# Patient Record
Sex: Female | Born: 1965 | Smoking: Current some day smoker
Health system: Southern US, Community
[De-identification: ages and names within clinical notes are randomized; demographics above are authoritative.]

---

## 2018-08-08 ENCOUNTER — Other Ambulatory Visit (HOSPITAL_COMMUNITY)
Admission: RE | Admit: 2018-08-08 | Discharge: 2018-08-08 | Disposition: A | Discharge status: Home / Self Care | Payer: Medicaid Other | Source: Ambulatory Visit | Attending: Nurse Practitioner | Admitting: Nurse Practitioner

## 2018-08-08 ENCOUNTER — Other Ambulatory Visit: Payer: Self-pay

## 2018-08-08 DIAGNOSIS — Z01411 Encounter for gynecological examination (general) (routine) with abnormal findings: Secondary | ICD-10-CM | POA: Diagnosis present

## 2018-08-10 LAB — CYTOLOGY - PAP
Adequacy: ABSENT
DIAGNOSIS: NEGATIVE
HPV (WINDOPATH): NOT DETECTED

## 2019-03-21 ENCOUNTER — Encounter: Payer: Self-pay | Admitting: *Deleted

## 2019-04-05 ENCOUNTER — Other Ambulatory Visit: Payer: Self-pay

## 2019-04-05 ENCOUNTER — Other Ambulatory Visit (HOSPITAL_COMMUNITY)
Admission: RE | Admit: 2019-04-05 | Discharge: 2019-04-05 | Disposition: A | Discharge status: Home / Self Care | Payer: Medicaid Other | Source: Ambulatory Visit | Attending: Obstetrics & Gynecology | Admitting: Obstetrics & Gynecology

## 2019-04-05 ENCOUNTER — Encounter: Payer: Self-pay | Admitting: Obstetrics & Gynecology

## 2019-04-05 ENCOUNTER — Ambulatory Visit (INDEPENDENT_AMBULATORY_CARE_PROVIDER_SITE_OTHER): Payer: Medicaid Other | Admitting: Obstetrics & Gynecology

## 2019-04-05 VITALS — BP 117/78 | HR 76 | Wt 147.0 lb

## 2019-04-05 DIAGNOSIS — N924 Excessive bleeding in the premenopausal period: Secondary | ICD-10-CM

## 2019-04-05 DIAGNOSIS — N9089 Other specified noninflammatory disorders of vulva and perineum: Secondary | ICD-10-CM

## 2019-04-05 DIAGNOSIS — N946 Dysmenorrhea, unspecified: Secondary | ICD-10-CM | POA: Insufficient documentation

## 2019-04-05 NOTE — Progress Notes (Addendum)
Patient ID: Maquita Munda, female   DOB: [birth year], 52 y.o.   MRN: [id number]  Chief Complaint  Patient presents with  . Dysmenorrhea    HPI Cassadee Muntean is a 53 y.o. female.  No obstetric history on file. No LMP recorded. Patient reports increasing menstrual pain and flow with more frequent periods as well over the last year. She also has hot flushes and fatigue. HPI  No past medical history on file.    No family history on file.  Social History Social History   Tobacco Use  . Smoking status: Current Some Day Smoker  Substance Use Topics  . Alcohol use: Not on file  . Drug use: Not on file    Allergies  Allergen Reactions  . Banana     Current Outpatient Medications  Medication Sig Dispense Refill  . levothyroxine (SYNTHROID) 125 MCG tablet Take 125 mcg by mouth daily before breakfast.    . buPROPion (WELLBUTRIN XL) 150 MG 24 hr tablet Take 150 mg by mouth daily.     No current facility-administered medications for this visit.     Review of Systems Review of Systems  Constitutional: Positive for fatigue.  Respiratory: Negative.   Cardiovascular: Negative.   Genitourinary: Positive for menstrual problem and vaginal pain. Negative for vaginal bleeding and vaginal discharge (vulvar lesion).    Blood pressure 117/78, pulse 76, weight 147 lb (66.7 kg).  Physical Exam Physical Exam Vitals signs and nursing note reviewed. Exam conducted with a chaperone present.  Constitutional:      Appearance: Normal appearance.  Cardiovascular:     Rate and Rhythm: Normal rate.  Pulmonary:     Effort: Pulmonary effort is normal.     Breath sounds: Normal breath sounds.  Abdominal:     General: Abdomen is flat.     Palpations: Abdomen is soft.  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  Neurological:     General: No focal  deficit present.     Mental Status: She is alert and oriented to person, place, and time.  Psychiatric:        Mood and Affect: Mood normal.   Breasts: breasts appear normal, no suspicious masses, no skin or nipple changes or axillary nodes.   Data Reviewed Pap normal 08/2018  Assessment Vulvar lesion There are no active problems to display for this patient.  Patient Active Problem List   Diagnosis Date Noted  . Excessive bleeding in premenopausal period 04/05/2019  . Vulvar lesion 04/05/2019  . Dysmenorrhea 04/05/2019      Plan Orders Placed This Encounter  Procedures  . US PELVIC COMPLETE WITH TRANSVAGINAL    Standing Status:   Future    Standing Expiration Date:   06/04/2020    Order Specific Question:   Reason for Exam (SYMPTOM  OR DIAGNOSIS REQUIRED)    Answer:   menorrhagia    Order Specific Question:   Preferred imaging location?    Answer:   [facility]  . Follicle stimulating hormone  . CBC   RTC for vulvar biopsy    [name] 04/05/2019, 9:18 AM

## 2019-04-05 NOTE — Patient Instructions (Signed)

## 2019-04-06 LAB — CBC
Hematocrit: 38.5 % (ref 34.0–46.6)
Hemoglobin: 12.4 g/dL (ref 11.1–15.9)
MCH: 28.1 pg (ref 26.6–33.0)
MCHC: 32.2 g/dL (ref 31.5–35.7)
MCV: 87 fL (ref 79–97)
Platelets: 246 10*3/uL (ref 150–450)
RBC: 4.41 x10E6/uL (ref 3.77–5.28)
RDW: 14 % (ref 11.7–15.4)
WBC: 5.9 10*3/uL (ref 3.4–10.8)

## 2019-04-06 LAB — FOLLICLE STIMULATING HORMONE: FSH: 24.1 m[IU]/mL

## 2019-04-08 ENCOUNTER — Telehealth: Payer: Self-pay

## 2019-04-08 LAB — CERVICOVAGINAL ANCILLARY ONLY
Bacterial Vaginitis (gardnerella): NEGATIVE
Candida Glabrata: NEGATIVE
Candida Vaginitis: NEGATIVE
Chlamydia: NEGATIVE
Comment: NEGATIVE
Comment: NEGATIVE
Comment: NEGATIVE
Comment: NEGATIVE
Comment: NEGATIVE
Comment: NORMAL
Neisseria Gonorrhea: NEGATIVE
Trichomonas: NEGATIVE

## 2019-04-08 NOTE — Telephone Encounter (Signed)
LM that I am calling with results if she could please give the office a call.

## 2019-04-08 NOTE — Telephone Encounter (Signed)
-----   Message from Woodroe Mode, MD sent at 04/08/2019 11:13 AM EST ----- Normal CBC, perimenopausal FSH level. Patient has f/u appointment 11/24

## 2019-04-09 NOTE — Telephone Encounter (Signed)
Pt called and I LM stating that the provider will f/u with her at her appt scheduled on 11/24 and that labs are normal.

## 2019-04-09 NOTE — Telephone Encounter (Signed)
Pt returned call and I LM stating that I was returning her call if she could please call the office.

## 2019-04-30 ENCOUNTER — Telehealth: Payer: Self-pay

## 2019-04-30 ENCOUNTER — Ambulatory Visit (INDEPENDENT_AMBULATORY_CARE_PROVIDER_SITE_OTHER): Payer: Medicaid Other | Admitting: Obstetrics & Gynecology

## 2019-04-30 ENCOUNTER — Other Ambulatory Visit (HOSPITAL_COMMUNITY)
Admission: RE | Admit: 2019-04-30 | Discharge: 2019-04-30 | Disposition: A | Discharge status: Home / Self Care | Payer: Medicaid Other | Source: Ambulatory Visit | Attending: Obstetrics & Gynecology | Admitting: Obstetrics & Gynecology

## 2019-04-30 ENCOUNTER — Other Ambulatory Visit: Payer: Self-pay

## 2019-04-30 ENCOUNTER — Encounter: Payer: Self-pay | Admitting: Obstetrics & Gynecology

## 2019-04-30 VITALS — BP 138/84 | HR 71 | Ht 62.0 in | Wt 171.9 lb

## 2019-04-30 DIAGNOSIS — N9089 Other specified noninflammatory disorders of vulva and perineum: Secondary | ICD-10-CM | POA: Insufficient documentation

## 2019-04-30 DIAGNOSIS — N904 Leukoplakia of vulva: Secondary | ICD-10-CM | POA: Diagnosis present

## 2019-04-30 NOTE — Telephone Encounter (Signed)
Called pt to advise of U/S APPT CHANGE TO 05/20/19 at 8am & to please arrive at 7:45a, with full bladder.no answer, left VM.

## 2019-04-30 NOTE — Patient Instructions (Signed)
Vulva Biopsy, Care After This sheet gives you information about how to care for yourself after your procedure. Your doctor may also give you more specific instructions. If you have problems or questions, contact your doctor. What can I expect after the procedure? After the procedure, it is common to have:  Slight bleeding from the biopsy site.  Soreness at the biopsy site. Follow these instructions at home: Biopsy site care   Follow instructions from your doctor about how to take care of your biopsy site. Make sure you: ? Clean the area using water and mild soap two times a day or as told by your doctor. Gently pat the area dry. ? If you were prescribed an antibiotic ointment, apply it as told by your doctor. Do not stop using the antibiotic even if your condition gets better. ? Take a warm water bath (sitz bath) as needed to help with pain. A sitz bath is taken while you are sitting down. The water should only come up to your hips and cover your butt. ? Leave stitches (sutures), skin glue, or skin tape (adhesive) strips in place. They may need to stay in place for 2 weeks or longer. If tape strips get loose and curl up, you may trim the loose edges. Do not remove tape strips completely unless your doctor says it is okay. ? Check your biopsy area every day for signs of infection. Check for:  More redness, swelling, or pain.  More fluid or blood.  Warmth.  Pus or a bad smell. ? Do not rub the biopsy area after peeing (urinating).  Gently pat the area dry, or use a bottle filled with warm water (peri-bottle) to clean the area.  Gently wipe from front to back. Lifestyle  Wear loose, cotton underwear.  Do not wear tight pants.  Do not use a tampon, douche, or put anything in your vagina for at least 1 week or until your doctor says it is okay.  Do not have sex for at least 1 week or until your doctor says it is okay.  Do not exercise until your doctor says it is okay.  Do not  swim or use a hot tub until your doctor says it is okay. You may shower or take a sitz bath. General instructions  Take over-the-counter and prescription medicines only as told by your doctor.  Use a sanitary pad until the bleeding stops.  Keep all follow-up visits as told by your doctor. This is important. Contact a doctor if:  You have more redness, swelling, or pain around your biopsy site.  You have more fluid or blood coming from your biopsy site.  Your biopsy site feels warm when you touch it.  Medicines do not help with your pain. Get help right away if:  You have a lot of bleeding from the vulva.  You have pus or a bad smell coming from your biopsy site.  You have a fever.  You have pain in the lower belly (abdomen). Summary  After the procedure, it is common to have slight bleeding and soreness at the biopsy site.  Follow all instructions as told by your doctor. Clean the area with water and mild soap. Do not rub. Pat the area dry.  Take sitz baths as needed. Leave any stitches in place.  Check your biopsy site for infection. Signs include more redness, swelling, pain, fluid, or blood, or feeling warm when you touch it.  Get help right away if you have a   lot of bleeding, a fever, pus or a bad smell, or pain in your lower belly. This information is not intended to replace advice given to you by your health care provider. Make sure you discuss any questions you have with your health care provider. Document Released: 08/19/2008 Document Revised: 11/23/2017 Document Reviewed: 11/23/2017 Elsevier Patient Education  2020 [employer].

## 2019-04-30 NOTE — Progress Notes (Signed)
Patient returns for biopsy of the right vulvar biopsy  Patient identified, informed consent signed, copy in chart, time out performed.    Area cleansed with Alcohol.  Injected with 1% Lidocaine with Epi.  8 mL. Area cleaned with Betadine and 4 mm punch biopsy performed without difficulty.  Hemostasis obtained with Silver Nitrate.  Patient tolerated procedure well.   Patient given post procedure instructions.  Patient will return in 4 weeks for biopsy results.  Woodroe Mode, MD 04/30/2019

## 2019-05-06 ENCOUNTER — Other Ambulatory Visit: Payer: Self-pay

## 2019-05-07 ENCOUNTER — Encounter: Payer: Self-pay | Admitting: *Deleted

## 2019-05-07 LAB — SURGICAL PATHOLOGY

## 2019-05-08 ENCOUNTER — Telehealth: Payer: Self-pay | Admitting: Lactation Services

## 2019-05-08 ENCOUNTER — Other Ambulatory Visit: Payer: Self-pay | Admitting: Obstetrics & Gynecology

## 2019-05-08 ENCOUNTER — Telehealth: Payer: Self-pay

## 2019-05-08 DIAGNOSIS — N9089 Other specified noninflammatory disorders of vulva and perineum: Secondary | ICD-10-CM

## 2019-05-08 MED ORDER — CLOBETASOL PROPIONATE 0.05 % EX OINT: 1.0000 "application " | TOPICAL_OINTMENT | Freq: Two times a day (BID) | CUTANEOUS | 0 refills | Status: AC

## 2019-05-08 NOTE — Telephone Encounter (Signed)
Attempted to call pt to give results of biopsy. Call went straight to voice mail. Advised pt to call the office for results. Pt was informed via message that a prescription has been sent to her pharmacy.

## 2019-05-08 NOTE — Telephone Encounter (Signed)
-----   Message from Woodroe Mode, MD sent at 05/08/2019  8:23 AM EST ----- Biopsy shows mild inflammation, benign. I will send a rx for steroid cream

## 2019-05-08 NOTE — Telephone Encounter (Signed)
Called pt again to inform of test results for Biopsy, still no answer, went straight to VM.

## 2019-05-13 ENCOUNTER — Ambulatory Visit (HOSPITAL_COMMUNITY): Payer: Medicaid Other

## 2019-05-20 ENCOUNTER — Other Ambulatory Visit: Payer: Self-pay

## 2019-05-20 ENCOUNTER — Ambulatory Visit (HOSPITAL_COMMUNITY)
Admission: RE | Admit: 2019-05-20 | Discharge: 2019-05-20 | Disposition: A | Discharge status: Home / Self Care | Payer: Medicaid Other | Source: Ambulatory Visit | Attending: Obstetrics & Gynecology | Admitting: Obstetrics & Gynecology

## 2019-05-20 DIAGNOSIS — N946 Dysmenorrhea, unspecified: Secondary | ICD-10-CM | POA: Diagnosis not present

## 2019-05-20 DIAGNOSIS — N924 Excessive bleeding in the premenopausal period: Secondary | ICD-10-CM | POA: Insufficient documentation

## 2019-06-10 ENCOUNTER — Other Ambulatory Visit: Payer: Self-pay

## 2019-06-10 ENCOUNTER — Encounter: Payer: Self-pay | Admitting: Obstetrics & Gynecology

## 2019-06-10 ENCOUNTER — Ambulatory Visit (INDEPENDENT_AMBULATORY_CARE_PROVIDER_SITE_OTHER): Payer: Medicaid Other | Admitting: Obstetrics & Gynecology

## 2019-06-10 DIAGNOSIS — D251 Intramural leiomyoma of uterus: Secondary | ICD-10-CM

## 2019-06-10 DIAGNOSIS — D259 Leiomyoma of uterus, unspecified: Secondary | ICD-10-CM | POA: Insufficient documentation

## 2019-06-10 NOTE — Progress Notes (Signed)
Patient ID: Linda Price, female   DOB: 1965/07/14, 54 y.o.   MRN: [id number]  Chief Complaint  Patient presents with  . Follow-up   No obstetric history on file. Patient's last menstrual period was 05/12/2018 (approximate).  HPI Linda Price is a 54 y.o. female.  Patient's last menstrual period was 05/12/2018 (approximate). She returns to review [place] result and bx which showed benign mild inflammation. She has started to use steroid cream with no complaints. HPI  No past medical history on file.  No past surgical history on file.  No family history on file.  Social History Social History   Tobacco Use  . Smoking status: Current Some Day Smoker  . Smokeless tobacco: Never Used  . Tobacco comment: on Wellbutrin  Substance Use Topics  . Alcohol use: Not on file  . Drug use: Not on file    Allergies  Allergen Reactions  . Banana   . Avocado Itching  . Pineapple Itching    Mouth Itches  . Shrimp [Shellfish Allergy] Itching    Current Outpatient Medications  Medication Sig Dispense Refill  . levothyroxine (SYNTHROID) 125 MCG tablet Take 125 mcg by mouth daily before breakfast.    . buPROPion (WELLBUTRIN XL) 150 MG 24 hr tablet Take 150 mg by mouth daily.    . clobetasol ointment (TEMOVATE) [id number] % Apply 1 application topically 2 (two) times daily. Apply to affected area for 2 weeks (Patient not taking: Reported on 06/10/2019) 30 g 0   No current facility-administered medications for this visit.    Review of Systems Review of Systems  Constitutional: Negative.   Respiratory: Negative.   Genitourinary: Positive for menstrual problem and vaginal discharge.  Musculoskeletal: Positive for arthralgias.    Blood pressure 118/63, pulse 77, height 5\' 2"  (1.575 m), weight 172 lb 8 oz (78.2 kg), last menstrual period 05/12/2018.  Physical Exam Physical Exam Constitutional:      Appearance: Normal appearance.  Cardiovascular:     Rate and Rhythm: Normal rate.   Pulmonary:     Effort: Pulmonary effort is normal.  Neurological:     Mental Status: She is alert.  Psychiatric:        Mood and Affect: Mood normal.        Behavior: Behavior normal.     Data Reviewed CLINICAL DATA:  54 year old female with dysmenorrhea. LMP 05/15/2019.  EXAM: TRANSABDOMINAL AND TRANSVAGINAL ULTRASOUND OF PELVIS  TECHNIQUE: Both transabdominal and transvaginal ultrasound examinations of the pelvis were performed. Transabdominal technique was performed for global imaging of the pelvis including uterus, ovaries, adnexal regions, and pelvic cul-de-sac. It was necessary to proceed with endovaginal exam following the transabdominal exam to visualize the endometrium and adnexa.  COMPARISON:  None  FINDINGS: Uterus  Measurements: 12.3 x 6.3 x 7.8 cm = volume: 320 mL. Enlarged myomatous anteverted uterus with solitary transmural 5.4 x 4.6 x 5.5 cm anterior right uterine body fibroid.  Endometrium  Thickness: 7 mm. No endometrial cavity fluid or focal endometrial mass. Questionable 1.2 x 0.5 x 0.8 cm polyp with internal vascularity on color Doppler in the upper endocervix.  Right ovary  Measurements: 2.4 x 0.9 x 1.7 cm = volume: 1.9 mL. Normal appearance/no adnexal mass.  Left ovary  Measurements: 3.4 x 2.8 x 2.7 cm = volume: 13.3 mL. Simple 2.6 x 2.0 x 1.9 cm left ovarian cyst. No suspicious left ovarian or left adnexal masses.  Other findings  No abnormal free fluid.  IMPRESSION: 1. Enlarged myomatous anteverted uterus with solitary  transmural 5.5 cm anterior right uterine body fibroid. 2. Questionable 1.2 cm polyp in the upper endocervix. 3. No suspicious ovarian or adnexal masses. Simple 2.6 cm left ovarian cyst, requiring no follow-up. This recommendation follows the consensus statement: Management of Asymptomatic Ovarian and Other Adnexal Cysts Imaged at [place]: Society of Radiologists in [place]. Radiology 2010; [phone].   Electronically Signed   By: Ilona Sorrel M.D.   On: 05/20/2019 09:21  Assessment Fibroid uterus with dysmenorrhea that responds to OTC ibuprofen up to 600 mg.  Benign vulvar Bx Ibuprofen prn during menses RTC to review in 6 months Benign pap 08/2018 Record release to PCP Dr. Ellsworth Lennox 06/10/2019, 8:42 AM

## 2019-06-10 NOTE — Patient Instructions (Signed)
Uterine Fibroids  Uterine fibroids are lumps of tissue (tumors) in your womb (uterus). They are not cancer (are benign). Most women with this condition do not need treatment. Sometimes fibroids can affect your ability to have children (your fertility). If that happens, you may need surgery to take out the fibroids. Follow these instructions at home:  Take over-the-counter and prescription medicines only as told by your doctor. Your doctor may suggest NSAIDs (such as aspirin or ibuprofen) to help with pain.  Ask your doctor if you should: ? Take iron pills. ? Eat more foods that have iron in them, such as dark green, leafy vegetables.  If directed, apply heat to your back or belly to reduce pain. Use the heat source that your doctor recommends, such as a moist heat pack or a heating pad. ? Put a towel between your skin and the heat source. ? Leave the heat on for 20-30 minutes. ? Remove the heat if your skin turns bright red. This is especially important if you are unable to feel pain, heat, or cold. You may have a greater risk of getting burned.  Pay close attention to your period (menstrual) cycles. Tell your doctor about any changes, such as: ? A heavier blood flow than usual. ? Needing to use more pads or tampons than normal. ? A change in how many days your period lasts. ? A change in symptoms that come with your period, such as cramps or back pain.  Keep all follow-up visits as told by your doctor. This is important. Your doctor may need to watch your fibroids over time for any changes. Contact a doctor if you:  Have pain that does not get better with medicine or heat, such as pain or cramps in: ? Your back. ? The area between your hip bones (pelvic area). ? Your belly.  Have new bleeding between your periods.  Have more bleeding during or between your periods.  Feel very tired or weak.  Feel light-headed. Get help right away if you:  Pass out (faint).  Have pain in the  area between your hip bones that suddenly gets worse.  Have bleeding that soaks a tampon or pad in 30 minutes or less. Summary  Uterine fibroids are lumps of tissue (tumors) in your womb (uterus). They are not cancer.  The only treatment that most women need is taking aspirin or ibuprofen for pain.  Contact a doctor if you have pain or cramps that do not get better with medicine.  Make sure you know what symptoms you should get help for right away. This information is not intended to replace advice given to you by your health care provider. Make sure you discuss any questions you have with your health care provider. Document Revised: 05/05/2017 Document Reviewed: 04/18/2017 Elsevier Patient Education  2020 [employer] Inc[employer]  

## 2019-11-28 ENCOUNTER — Ambulatory Visit (INDEPENDENT_AMBULATORY_CARE_PROVIDER_SITE_OTHER): Payer: Medicaid Other | Admitting: Obstetrics & Gynecology

## 2019-11-28 ENCOUNTER — Encounter: Payer: Self-pay | Admitting: Obstetrics & Gynecology

## 2019-11-28 ENCOUNTER — Other Ambulatory Visit: Payer: Self-pay

## 2019-11-28 VITALS — BP 134/68 | HR 75 | Ht 62.0 in | Wt 166.7 lb

## 2019-11-28 DIAGNOSIS — N924 Excessive bleeding in the premenopausal period: Secondary | ICD-10-CM

## 2019-11-28 DIAGNOSIS — D251 Intramural leiomyoma of uterus: Secondary | ICD-10-CM | POA: Diagnosis not present

## 2019-11-28 DIAGNOSIS — N946 Dysmenorrhea, unspecified: Secondary | ICD-10-CM | POA: Diagnosis not present

## 2019-11-28 NOTE — Progress Notes (Signed)
Patient ID: Linda Price, female   DOB: 05/21/1966, 54 y.o.   MRN: [id number]  Chief Complaint  Patient presents with  . Follow-up    HPI Linda Price is a 54 y.o. female.  [id number] Returns to discuss uterine fibroids, dysmenorrhea and recent vulvar biopsy, which was benign. Patient's last menstrual period was 11/07/2019 (approximate). Menses less frequent but last 8 days with cramps HPI  No past medical history on file.  No past surgical history on file.  No family history on file.  Social History Social History   Tobacco Use  . Smoking status: Current Some Day Smoker  . Smokeless tobacco: Never Used  . Tobacco comment: on Wellbutrin  Substance Use Topics  . Alcohol use: Not on file  . Drug use: Not on file    Allergies  Allergen Reactions  . Banana   . Avocado Itching  . Pineapple Itching    Mouth Itches  . Shrimp [Shellfish Allergy] Itching    Current Outpatient Medications  Medication Sig Dispense Refill  . levothyroxine (SYNTHROID) 125 MCG tablet Take 125 mcg by mouth daily before breakfast.    . buPROPion (WELLBUTRIN XL) 150 MG 24 hr tablet Take 150 mg by mouth daily. (Patient not taking: Reported on 11/28/2019)    . clobetasol ointment (TEMOVATE) 8.03 % Apply 1 application topically 2 (two) times daily. Apply to affected area for 2 weeks (Patient not taking: Reported on 06/10/2019) 30 g 0   No current facility-administered medications for this visit.    Review of Systems Review of Systems  Constitutional: Negative.   Endocrine:       Hot flushes  Genitourinary: Positive for menstrual problem. Negative for vaginal discharge.    Blood pressure 134/68, pulse 75, height 5\' 2"  (1.575 m), weight 166 lb 11.2 oz (75.6 kg), last menstrual period 11/07/2019.  Physical Exam Physical Exam Constitutional:      Appearance: Normal appearance.  Cardiovascular:     Rate and Rhythm: Normal rate.  Pulmonary:     Effort: Pulmonary effort is normal.   Genitourinary:    General: Normal vulva.     Vagina: No vaginal discharge.     Comments: Cervix nulliparous, no lesion    Data Reviewed [place] report 2020  Assessment Fibroid uterus Benign finding of cervical polyp discussed Dysmenorrhea  Plan Ibuprofen during menses is ok  Routine gyn f/u Perimenopause and menopause discussed    Linda Price 11/28/2019, 4:00 PM

## 2019-11-28 NOTE — Patient Instructions (Signed)

## 2019-12-17 ENCOUNTER — Ambulatory Visit
Admission: RE | Admit: 2019-12-17 | Discharge: 2019-12-17 | Disposition: A | Discharge status: Home / Self Care | Payer: Medicaid Other | Source: Ambulatory Visit | Attending: Nurse Practitioner | Admitting: Nurse Practitioner

## 2019-12-17 ENCOUNTER — Other Ambulatory Visit: Payer: Self-pay | Admitting: Nurse Practitioner

## 2019-12-17 ENCOUNTER — Other Ambulatory Visit: Payer: Self-pay

## 2019-12-17 DIAGNOSIS — M79605 Pain in left leg: Secondary | ICD-10-CM

## 2019-12-17 DIAGNOSIS — M545 Low back pain, unspecified: Secondary | ICD-10-CM

## 2020-01-14 ENCOUNTER — Ambulatory Visit: Payer: Medicaid Other | Attending: Orthopaedic Surgery | Admitting: Physical Therapy

## 2020-01-14 ENCOUNTER — Encounter: Payer: Self-pay | Admitting: Physical Therapy

## 2020-01-14 ENCOUNTER — Other Ambulatory Visit: Payer: Self-pay

## 2020-01-14 DIAGNOSIS — M542 Cervicalgia: Secondary | ICD-10-CM

## 2020-01-14 DIAGNOSIS — M5440 Lumbago with sciatica, unspecified side: Secondary | ICD-10-CM | POA: Insufficient documentation

## 2020-01-14 DIAGNOSIS — M6281 Muscle weakness (generalized): Secondary | ICD-10-CM | POA: Insufficient documentation

## 2020-01-15 NOTE — Therapy (Signed)
[place], [place], [address] Phone: [phone]   Fax:  [phone]  Physical Therapy Evaluation  Patient Details  Name: Linda Price MRN: [id number] Date of Birth: Jun 16, 1965 Referring Provider (PT): Ophelia Charter    Encounter Date: 01/14/2020   PT End of Session - 01/15/20 1203    Visit Number 1    Number of Visits 15    Date for PT Re-Evaluation 03/10/20    Authorization Type MCD    PT Start Time 7353    PT Stop Time 1700    PT Time Calculation (min) 45 min    Activity Tolerance Patient tolerated treatment well    Behavior During Therapy [facility] for tasks assessed/performed           History reviewed. No pertinent past medical history.  Past Surgical History:  Procedure Laterality Date  . CESAREAN SECTION      There were no vitals filed for this visit.    Subjective Assessment - 01/14/20 1619    Subjective Patient presents with neck and back pain for about 3 weeks. She had severe LLE pain Sciatica.  Steriods and meds improved it. Cont to have pain in neck, upper back and also LLE (Rt sciatica is chronic). She has been told she may have a pinched nerve.  L arm feels abnormal sensation.  Denies overt weakness in UE and LE.  She would like to try PT to see if it can help her pain .    Pertinent History Rt sided fibroid, R sciatica.    Limitations Lifting;Standing;Walking;House hold activities    Diagnostic tests XR spine:Degenerative disc disease of the lower lumbar spine,Degenerative disc and degenerative joint disease at C5-6 and C6-7resulting in bilateral neural foraminal narrowing, most severe onthe left at C6-7.    Patient Stated Goals Pain relief, move better    Currently in Pain? Yes    Pain Score 3     Pain Location Neck    Pain Orientation Left;Posterior    Pain Descriptors / Indicators Tightness    Pain Type Chronic pain    Pain Radiating Towards L UE    Pain Onset 1 to 4 weeks ago      Pain Frequency Intermittent    Aggravating Factors  overactivity , lifting    Pain Relieving Factors rest, modify activity    Effect of Pain on Daily Activities uncomfortable,work    Multiple Pain Sites Yes    Pain Score 3    Pain Location Back    Pain Orientation Left;Posterior;Lateral    Pain Descriptors / Indicators Aching    Pain Type Acute pain    Pain Radiating Towards LLE thigh    Pain Onset 1 to 4 weeks ago    Pain Frequency Intermittent    Aggravating Factors  not sure , activity, bending, pushing    Pain Relieving Factors rest, meds    Effect of Pain on Daily Activities limted work              [facility] PT Assessment - 01/15/20 0001      Assessment   Medical Diagnosis neck and back pain     Referring Provider (PT) Dax Varkey     Hand Dominance Right    Prior Therapy No       Precautions   Precautions None      Restrictions   Weight Bearing Restrictions No      Balance Screen   Has the patient fallen  in the past 6 months No      Prior Function   Level of Independence Independent    Vocation Full time employment    Vocation Requirements waiting tables and cashier     Leisure kayak, outdoors, live music      Cognition   Overall Cognitive Status Within Functional Limits for tasks assessed      Observation/Other Assessments   Focus on Therapeutic Outcomes (FOTO)  NT MCD       Sensation   Light Touch Appears Intact      Coordination   Gross Motor Movements are Fluid and Coordinated Not tested      Posture/Postural Control   Posture/Postural Control Postural limitations    Postural Limitations Rounded Shoulders;Forward head      AROM   Cervical Flexion 58   pain    Cervical Extension 60    Cervical - Right Side Bend 45    Cervical - Left Side Bend 45    Cervical - Right Rotation 60     Cervical - Left Rotation 60    feels more stiff   Lumbar Flexion WFL     Lumbar Extension WFL     Lumbar - Right Side Bend WFL    Lumbar - Left Side Bend WFL     Lumbar - Right Rotation WFL    Lumbar - Left Rotation [facility]       Strength   Right Shoulder Flexion 4/5    Right Shoulder ABduction 4/5    Left Shoulder Flexion 3+/5    Left Shoulder ABduction 3+/5    Right Elbow Flexion 5/5    Right Elbow Extension 4/5    Left Elbow Flexion 5/5    Left Elbow Extension 4-/5    Right Knee Flexion 5/5    Right Knee Extension 4+/5    Left Knee Flexion 5/5    Left Knee Extension 4+/5      Flexibility   Soft Tissue Assessment /Muscle Length --   decreased L hip ER compared to R      Palpation   Palpation comment sore, painful bilateral suboccipitals, posterior cervicals.  Sore in R post hip, gluteals and piriformis                       Objective measurements completed on examination: See above findings.               PT Education - 01/15/20 1203    Education Details PT/POC, dry needling, eval findings, HEP    Person(s) Educated Patient    Methods Explanation;Handout;Demonstration    Comprehension Verbalized understanding;Returned demonstration            PT Short Term Goals - 01/15/20 1204      PT SHORT TERM GOAL #1   Title Pt will be I with HEP for cervical ROM and lumbar    Baseline unknown on eval    Time 4    Period Weeks    Status New    Target Date 02/11/20      PT SHORT TERM GOAL #2   Title Pt will understand and demo corrected posture and body mechanics as it relates to her job.    Baseline unknown, needs reinforcement    Time 4    Period Weeks    Status New    Target Date 02/11/20             PT Long Term Goals - 01/15/20 1208  PT LONG TERM GOAL #1   Title Pt will show normal AROM of cervical spine without increased pain    Baseline limited by pain in flexion, extension, tight in lateral flexion    Time 8    Period Weeks    Status New    Target Date 03/10/20      PT LONG TERM GOAL #2   Title Pt will be able to return to work full duty without limitation of pain for up to 8 hours     Baseline limited to 4-5 hours , pain in back and leg    Time 8    Period Weeks    Status New    Target Date 03/10/20      PT LONG TERM GOAL #3   Title Pt will be able to report no further L UE abnormal sensation.    Baseline moderately tingly and numb    Time 8    Period Weeks    Status New    Target Date 03/10/20      PT LONG TERM GOAL #4   Title Pt will increase UE strength to 4+/5 bilateral for maximal shoulder support while lifting.    Baseline 3+/5 to 4/5    Time 8    Period Weeks    Status New    Target Date 03/10/20                  Plan - 01/15/20 1214    Clinical Impression Statement Patient presents for low complexity eval of neck and back pain which is recent onset (3 weeks) and is already improving.  She has UE weakness, tightness in bilateral hips, weakness in core.  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  She may have some spinal nerve compression in neck based on symptoms but it should improve with postural training and corrective exercises.    Personal Factors and Comorbidities Behavior Pattern;Comorbidity 1;Profession    Comorbidities smoking, previous sciatica    Examination-Activity Limitations Lift;Stand;Locomotion Level;Bend;Reach Overhead;Carry    Statistician;Interpersonal Relationship;Shop;Occupation;Cleaning    Stability/Clinical Decision Making Stable/Uncomplicated    Clinical Decision Making Low    Rehab Potential Excellent    PT Frequency 1x / week    PT Duration 4 weeks   then 1-2 times per week if improving   PT Treatment/Interventions ADLs/Self Care Home Management;Cryotherapy;Ultrasound;Traction;Moist Heat;Functional mobility training;Manual techniques;Dry needling;Passive range of motion;Neuromuscular re-education;Therapeutic exercise;Taping;Therapeutic activities;Electrical Stimulation    PT Next Visit Plan check HEP, ROM in neck, trunk. Core and posture    PT  Home Exercise Plan cervical ROM, piriformis stretch?    Consulted and Agree with Plan of Care Patient           Patient will benefit from skilled therapeutic intervention in order to improve the following deficits and impairments:  Increased fascial restricitons, Pain, Impaired sensation, Postural dysfunction, Decreased mobility, Decreased range of motion, Decreased strength, Impaired UE functional use, Impaired flexibility, Difficulty walking  Visit Diagnosis: Cervicalgia  Acute left-sided low back pain with sciatica, sciatica laterality unspecified  Muscle weakness (generalized)     Problem List Patient Active Problem List   Diagnosis Date Noted  . Fibroid uterus 06/10/2019  . Excessive bleeding in premenopausal period 04/05/2019  . Vulvar lesion 04/05/2019  . Dysmenorrhea 04/05/2019    Ruddy Swire 01/15/2020, 12:23 PM  [facility] [address] [place], [place], [address] Phone: [phone]   Fax:  [phone]  Name: Linda Price MRN: [id number] Date of Birth: 12-01-1965   Raeford Razor, PT 01/15/20 12:23 PM Phone: [phone] Fax: [phone]

## 2020-01-15 NOTE — Patient Instructions (Signed)
Access Code: [email or web address]: 08/11/2021Prepared by: Anderson Malta PaaExercises  Seated Cervical Sidebending Stretch - 2 x daily - 7 x weekly - 1 sets - 3 reps - 30 hold  Seated Levator Scapulae Stretch - 2 x daily - 7 x weekly - 1 sets - 3 reps - 30 hold  Supine Piriformis Stretch with Leg Straight - 2 x daily - 7 x weekly - 1 sets - 3 reps - 30 hold

## 2020-01-28 ENCOUNTER — Other Ambulatory Visit: Payer: Self-pay

## 2020-01-28 ENCOUNTER — Encounter: Payer: Self-pay | Admitting: Physical Therapy

## 2020-01-28 ENCOUNTER — Ambulatory Visit: Payer: Medicaid Other | Admitting: Physical Therapy

## 2020-01-28 DIAGNOSIS — M6281 Muscle weakness (generalized): Secondary | ICD-10-CM

## 2020-01-28 DIAGNOSIS — M5440 Lumbago with sciatica, unspecified side: Secondary | ICD-10-CM

## 2020-01-28 DIAGNOSIS — M542 Cervicalgia: Secondary | ICD-10-CM

## 2020-01-29 NOTE — Therapy (Signed)
[place], [place], [address] Phone: [phone]   Fax:  [phone]  Physical Therapy Treatment  Patient Details  Name: Linda Price MRN: [id number] Date of Birth: 07/21/1965 Referring Provider (PT): Ophelia Charter    Encounter Date: 01/28/2020   PT End of Session - 01/29/20 0911    Visit Number 2    Number of Visits 15    Date for PT Re-Evaluation 03/10/20    Authorization Type MCD    PT Start Time 6606    PT Stop Time 1700    PT Time Calculation (min) 45 min    Activity Tolerance Patient tolerated treatment well    Behavior During Therapy [facility] for tasks assessed/performed           History reviewed. No pertinent past medical history.  Past Surgical History:  Procedure Laterality Date  . CESAREAN SECTION      There were no vitals filed for this visit.   Subjective Assessment - 01/28/20 1623    Subjective I feel a little more loose than previous,.  Low back and neck stiff.    Currently in Pain? Yes    Pain Score 3     Pain Location Neck    Pain Orientation Left    Pain Descriptors / Indicators Tightness    Pain Type Chronic pain    Pain Onset 1 to 4 weeks ago    Pain Frequency Intermittent    Pain Score 4    Pain Location Back    Pain Orientation Left;Posterior;Right    Pain Descriptors / Indicators Aching;Sore;Tightness    Pain Type Chronic pain;Acute pain    Pain Onset 1 to 4 weeks ago    Pain Frequency Intermittent              [facility] Adult PT Treatment/Exercise - 01/29/20 0001      Lumbar Exercises: Stretches   Single Knee to Chest Stretch 2 reps;30 seconds    Lower Trunk Rotation 5 reps;10 seconds    Piriformis Stretch 3 reps;30 seconds      Lumbar Exercises: Supine   Ab Set 10 reps    Pelvic Tilt 10 reps    Clam 10 reps    Clam Limitations done bilateral with and without bands, also with balance disc under hips     Bent Knee Raise 10 reps    Bent Knee Raise  Limitations disc under hips, multiple sets from hooklying and from tabletop (toe taps uni and bilateral)     Dead Bug 10 reps      Lumbar Exercises: Quadruped   Other Quadruped Lumbar Exercises childs pose FW and lateral x 3 each       Manual Therapy   Manual Therapy Soft tissue mobilization;Passive ROM    Soft tissue mobilization post cervicals, L > R upper trap, levator scap    Passive ROM lateral flexion and rotation, gentle traction                   PT Education - 01/29/20 0911    Education Details core stabilization , HEP    Person(s) Educated Patient    Methods Explanation;Demonstration    Comprehension Returned demonstration;Verbalized understanding            PT Short Term Goals - 01/15/20 1204      PT SHORT TERM GOAL #1   Title Pt will be I with HEP for cervical ROM and lumbar    Baseline unknown  on eval    Time 4    Period Weeks    Status New    Target Date 02/11/20      PT SHORT TERM GOAL #2   Title Pt will understand and demo corrected posture and body mechanics as it relates to her job.    Baseline unknown, needs reinforcement    Time 4    Period Weeks    Status New    Target Date 02/11/20             PT Long Term Goals - 01/15/20 1208      PT LONG TERM GOAL #1   Title Pt will show normal AROM of cervical spine without increased pain    Baseline limited by pain in flexion, extension, tight in lateral flexion    Time 8    Period Weeks    Status New    Target Date 03/10/20      PT LONG TERM GOAL #2   Title Pt will be able to return to work full duty without limitation of pain for up to 8 hours    Baseline limited to 4-5 hours , pain in back and leg    Time 8    Period Weeks    Status New    Target Date 03/10/20      PT LONG TERM GOAL #3   Title Pt will be able to report no further L UE abnormal sensation.    Baseline moderately tingly and numb    Time 8    Period Weeks    Status New    Target Date 03/10/20      PT LONG TERM  GOAL #4   Title Pt will increase UE strength to 4+/5 bilateral for maximal shoulder support while lifting.    Baseline 3+/5 to 4/5    Time 8    Period Weeks    Status New    Target Date 03/10/20                 Plan - 01/29/20 0914    Clinical Impression Statement Patient is feeling better with the stretches that have been given. Pain is trending towards improvement.  She has limitations in her ability to work longer hours and maintain posture.  Will cont to benefit from skilled PT .    PT Treatment/Interventions ADLs/Self Care Home Management;Cryotherapy;Ultrasound;Traction;Moist Heat;Functional mobility training;Manual techniques;Dry needling;Passive range of motion;Neuromuscular re-education;Therapeutic exercise;Taping;Therapeutic activities;Electrical Stimulation    PT Next Visit Plan check HEP, ROM in neck, trunk. Core and posture    PT Home Exercise Plan cervical ROM, piriformis stretch, childs pose, PPT/TrA and bent knee fall out    Consulted and Agree with Plan of Care Patient           Patient will benefit from skilled therapeutic intervention in order to improve the following deficits and impairments:  Increased fascial restricitons, Pain, Impaired sensation, Postural dysfunction, Decreased mobility, Decreased range of motion, Decreased strength, Impaired UE functional use, Impaired flexibility, Difficulty walking  Visit Diagnosis: Cervicalgia  Acute left-sided low back pain with sciatica, sciatica laterality unspecified  Muscle weakness (generalized)     Problem List Patient Active Problem List   Diagnosis Date Noted  . Fibroid uterus 06/10/2019  . Excessive bleeding in premenopausal period 04/05/2019  . Vulvar lesion 04/05/2019  . Dysmenorrhea 04/05/2019    Halo Shevlin 01/29/2020, 9:22 AM  [facility] [address] [place], [place], [address] Phone: [phone]   Fax:  [phone]  Name: Fredia Chittenden MRN: [id number] Date of Birth: 12-Jan-1966  Raeford Razor, PT 01/29/20 9:22 AM Phone: [phone] Fax: [phone]

## 2020-01-29 NOTE — Patient Instructions (Signed)
Added to HEP: Transverse Abd isometric 2 x 10 x 5 sec x 2 per day  Bent knee fall out 2 x 10 x 2 PER DAY  Child's pose x 3 x 30 sec

## 2020-02-06 ENCOUNTER — Other Ambulatory Visit: Payer: Self-pay

## 2020-02-06 ENCOUNTER — Ambulatory Visit: Payer: Medicaid Other | Attending: Orthopaedic Surgery | Admitting: Physical Therapy

## 2020-02-06 ENCOUNTER — Encounter: Payer: Self-pay | Admitting: Physical Therapy

## 2020-02-06 DIAGNOSIS — M5440 Lumbago with sciatica, unspecified side: Secondary | ICD-10-CM | POA: Diagnosis present

## 2020-02-06 DIAGNOSIS — M6281 Muscle weakness (generalized): Secondary | ICD-10-CM | POA: Diagnosis present

## 2020-02-06 DIAGNOSIS — M542 Cervicalgia: Secondary | ICD-10-CM | POA: Diagnosis not present

## 2020-02-06 NOTE — Therapy (Signed)
[place], [place], [address] Phone: [phone]   Fax:  [phone]  Physical Therapy Treatment  Patient Details  Name: Linda Price MRN: [id number] Date of Birth: 09-25-1965 Referring Provider (PT): Ophelia Charter    Encounter Date: 02/06/2020   PT End of Session - 02/06/20 1633    Visit Number 3    Number of Visits 15    Date for PT Re-Evaluation 03/10/20    Authorization Type MCD    Authorization - Visit Number 2    Progress Note Due on Visit 27    PT Start Time 1633    PT Stop Time 1713    PT Time Calculation (min) 40 min    Activity Tolerance Patient tolerated treatment well    Behavior During Therapy [facility] for tasks assessed/performed           History reviewed. No pertinent past medical history.  Past Surgical History:  Procedure Laterality Date  . CESAREAN SECTION      There were no vitals filed for this visit.   Subjective Assessment - 02/06/20 1636    Subjective "I am doing pretty good, I have some increased stiffness in the neck. The stretches help but it seems more than it was last week."    Currently in Pain? Yes    Pain Score 4     Pain Location Neck    Pain Orientation Left    Pain Descriptors / Indicators Tightness    Pain Type Chronic pain    Pain Onset 1 to 4 weeks ago              [facility] PT Assessment - 02/06/20 0001      Assessment   Medical Diagnosis neck and back pain     Referring Provider (PT) Ophelia Charter                          [facility] Adult PT Treatment/Exercise - 02/06/20 0001      Neck Exercises: Machines for Strengthening   UBE (Upper Arm Bike) L1 x 4 min    fwd/bwd 2 min     Neck Exercises: Theraband   Rows 15 reps;Green    Other Theraband Exercises scapular retraction with ER 2 x 10 with green band    Other Theraband Exercises lower trap strengthening with elbows propped on bolster 2 x 12 with yellow band      Manual Therapy    Manual Therapy Joint mobilization    Manual therapy comments skilled palpation and monitoring of pt throghout TPDN    Joint Mobilization T1 -T7 PA grade III, bil inferior 1st rib mobs grade III    Soft tissue mobilization IASTM along the L cerivlca paraspinals, L upper trap/ rhomboids      Neck Exercises: Stretches   Upper Trapezius Stretch 30 seconds;Left;2 reps    Levator Stretch 30 seconds;Left;2 reps    Other Neck Stretches rhomboid stretch with hands crossed on door knob 2 x 30 sec            Trigger Point Dry Needling - 02/06/20 0001    Consent Given? Yes    Education Handout Provided Yes    Muscles Treated Head and Neck Cervical multifidi;Upper trapezius    Muscles Treated Upper Quadrant Rhomboids    Upper Trapezius Response Twitch reponse elicited;Palpable increased muscle length   L   Cervical multifidi Response Twitch reponse elicited;Palpable increased muscle length  L   Rhomboids Response Palpable increased muscle length;Twitch response elicited   L               PT Education - 02/06/20 1703    Education Details muscle anatomy and referral patterns, what TPDN is, benefits and what to expect. benefits of avoiding hiking / rolling shoulders and perform stretching to decrease tension in upper traps    Person(s) Educated Patient    Methods Explanation;Verbal cues    Comprehension Verbalized understanding;Verbal cues required            PT Short Term Goals - 01/15/20 1204      PT SHORT TERM GOAL #1   Title Pt will be I with HEP for cervical ROM and lumbar    Baseline unknown on eval    Time 4    Period Weeks    Status New    Target Date 02/11/20      PT SHORT TERM GOAL #2   Title Pt will understand and demo corrected posture and body mechanics as it relates to her job.    Baseline unknown, needs reinforcement    Time 4    Period Weeks    Status New    Target Date 02/11/20             PT Long Term Goals - 01/15/20 1208      PT LONG TERM GOAL #1    Title Pt will show normal AROM of cervical spine without increased pain    Baseline limited by pain in flexion, extension, tight in lateral flexion    Time 8    Period Weeks    Status New    Target Date 03/10/20      PT LONG TERM GOAL #2   Title Pt will be able to return to work full duty without limitation of pain for up to 8 hours    Baseline limited to 4-5 hours , pain in back and leg    Time 8    Period Weeks    Status New    Target Date 03/10/20      PT LONG TERM GOAL #3   Title Pt will be able to report no further L UE abnormal sensation.    Baseline moderately tingly and numb    Time 8    Period Weeks    Status New    Target Date 03/10/20      PT LONG TERM GOAL #4   Title Pt will increase UE strength to 4+/5 bilateral for maximal shoulder support while lifting.    Baseline 3+/5 to 4/5    Time 8    Period Weeks    Status New    Target Date 03/10/20                 Plan - 02/06/20 1721    Clinical Impression Statement pt reported increased stiffnes in the L upper trap but notes being consistent with her HEP. educated and conset was provided for TPDN of the L cervical multifidi, upper trap and rhomboids followed with IASTM techniques and thoracic/ rib mobs. she responded well to posterior shoulder strengthening and reported decreased pain inthe neck and additionally reported decreased N/T in the LLE.    PT Treatment/Interventions ADLs/Self Care Home Management;Cryotherapy;Ultrasound;Traction;Moist Heat;Functional mobility training;Manual techniques;Dry needling;Passive range of motion;Neuromuscular re-education;Therapeutic exercise;Taping;Therapeutic activities;Electrical Stimulation    PT Next Visit Plan re-assess/ submit to MCD. check HEP, ROM in neck, trunk. Core and posture, responsed to DN  PT Home Exercise Plan cervical ROM, piriformis stretch, childs pose, PPT/TrA and bent knee fall out    Consulted and Agree with Plan of Care Patient            Patient will benefit from skilled therapeutic intervention in order to improve the following deficits and impairments:  Increased fascial restricitons, Pain, Impaired sensation, Postural dysfunction, Decreased mobility, Decreased range of motion, Decreased strength, Impaired UE functional use, Impaired flexibility, Difficulty walking  Visit Diagnosis: Cervicalgia  Acute left-sided low back pain with sciatica, sciatica laterality unspecified  Muscle weakness (generalized)     Problem List Patient Active Problem List   Diagnosis Date Noted  . Fibroid uterus 06/10/2019  . Excessive bleeding in premenopausal period 04/05/2019  . Vulvar lesion 04/05/2019  . Dysmenorrhea 04/05/2019   Starr Lake PT, DPT, LAT, ATC  02/06/20  5:29 PM      [facility] [address] [place], [place], [address] Phone: [phone]   Fax:  [phone]  Name: Mairely Foxworth MRN: [id number] Date of Birth: 1966/01/26

## 2020-02-13 ENCOUNTER — Ambulatory Visit: Payer: Medicaid Other | Admitting: Physical Therapy

## 2020-02-13 ENCOUNTER — Other Ambulatory Visit: Payer: Self-pay

## 2020-02-13 ENCOUNTER — Encounter: Payer: Self-pay | Admitting: Physical Therapy

## 2020-02-13 DIAGNOSIS — M6281 Muscle weakness (generalized): Secondary | ICD-10-CM

## 2020-02-13 DIAGNOSIS — M542 Cervicalgia: Secondary | ICD-10-CM | POA: Diagnosis not present

## 2020-02-13 DIAGNOSIS — M5440 Lumbago with sciatica, unspecified side: Secondary | ICD-10-CM

## 2020-02-13 NOTE — Therapy (Signed)
[place], [place], [address] Phone: [phone]   Fax:  [phone]  Physical Therapy Treatment  Patient Details  Name: Linda Price MRN: [id number] Date of Birth: 10-09-1965 Referring Provider (PT): Ophelia Charter    Encounter Date: 02/13/2020   PT End of Session - 02/13/20 0918    Visit Number 4    Number of Visits 15    Date for PT Re-Evaluation 03/10/20    Authorization Type MCD    Authorization - Visit Number 2    Authorization - Number of Visits 27    PT Start Time [phone]    PT Stop Time 1000    PT Time Calculation (min) 44 min    Activity Tolerance Patient tolerated treatment well    Behavior During Therapy [facility] for tasks assessed/performed           History reviewed. No pertinent past medical history.  Past Surgical History:  Procedure Laterality Date  . CESAREAN SECTION      There were no vitals filed for this visit.   Subjective Assessment - 02/13/20 0919    Subjective The needling was very helpful.  Neck is 2/10.  Low back 2/10.  Has already took a hot shower and had a deep massage so that helped.   Would like to cont PT.    Diagnostic tests XR spine:Degenerative disc disease of the lower lumbar spine,Degenerative disc and degenerative joint disease at C5-6 and C6-7resulting in bilateral neural foraminal narrowing, most severe onthe left at C6-7.    Patient Stated Goals Pain relief, move better    Currently in Pain? Yes    Pain Score 2     Pain Location Neck    Pain Orientation Left;Posterior    Pain Descriptors / Indicators Tightness    Pain Type Chronic pain    Pain Onset More than a month ago    Pain Frequency Intermittent    Aggravating Factors  overrwork, lift    Pain Relieving Factors rest, stretches    Pain Score 2    Pain Location Back    Pain Orientation Lower    Pain Descriptors / Indicators Aching    Pain Type Chronic pain    Pain Radiating Towards Rt sciatica at  times, calves tighten and cramp    Pain Onset More than a month ago    Pain Frequency Intermittent    Aggravating Factors  bending, pushing , lifting walking    Pain Relieving Factors rest, meds, stretches              [facility] PT Assessment - 02/13/20 0001      AROM   Cervical Flexion 60 min pain     Cervical Extension 56 min pain     Cervical - Right Side Bend 46    Cervical - Left Side Bend 57    Cervical - Right Rotation WFL     Cervical - Left Rotation [facility]     Lumbar Flexion ankles     Lumbar Extension min pain     Lumbar - Right Side Bend WFL    Lumbar - Left Side Bend WFL    Lumbar - Right Rotation [facility]    Lumbar - Left Rotation [facility]       Strength   Right Shoulder Flexion 4/5    Right Shoulder ABduction 4+/5    Left Shoulder Flexion 4-/5    Left Shoulder ABduction 4/5  [place] Adult PT Treatment/Exercise - 02/13/20 0001      Neck Exercises: Standing   Other Standing Exercises foam roller : scapular stabilization : narrow grip , horizontal abduction, lat pull down and ER/IR x 10       Lumbar Exercises: Stretches   Active Hamstring Stretch 3 reps;30 seconds    Active Hamstring Stretch Limitations strap     Lower Trunk Rotation Limitations in figure 4 position , x 5 each side     Figure 4 Stretch 3 reps;30 seconds      Lumbar Exercises: Supine   Ab Set 10 reps    Bent Knee Raise 10 reps    Bent Knee Raise Limitations and reverse hands under sacrum     Dead Bug Limitations variations x 10 reps (knee and hip ext) , 2 sets     Bridge 10 reps    Bridge with clamshell 10 reps      Lumbar Exercises: Sidelying   Clam 20 reps      Neck Exercises: Stretches   Upper Trapezius Stretch 30 seconds;Left;2 reps    Levator Stretch 30 seconds;Left;2 reps                    PT Short Term Goals - 02/13/20 0921      PT SHORT TERM GOAL #1   Title Pt will be I with HEP for cervical ROM and lumbar    Baseline not consistent    Status On-going       PT SHORT TERM GOAL #2   Title Pt will understand and demo corrected posture and body mechanics as it relates to her job.    Status Achieved             PT Long Term Goals - 02/13/20 1610      PT LONG TERM GOAL #1   Title Pt will show normal AROM of cervical spine without increased pain    Baseline see flow sheets    Status On-going      PT LONG TERM GOAL #2   Title Pt will be able to return to work full duty without limitation of pain for up to 8 hours    Baseline has noticed less pain with work, still can be moderate (5 hours)    Status On-going      PT LONG TERM GOAL #3   Title Pt will be able to report no further L UE abnormal sensation.    Baseline improved , has not noticed as much    Status On-going      PT LONG TERM GOAL #4   Title Pt will increase UE strength to 4+/5 bilateral for maximal shoulder support while lifting.    Baseline improved see flowsheet    Status On-going                 Plan - 02/13/20 0934    Clinical Impression Statement Patient is making progress towasrds goals. .  She is stronger and has less pain overall with functionall mobility .  She will cont to benefit from PT to improve core strength and further centralize her symptoms.    Personal Factors and Comorbidities Behavior Pattern;Comorbidity 1;Profession    Comorbidities smoking, previous sciatica    Examination-Activity Limitations Lift;Stand;Locomotion Level;Bend;Reach Overhead;Carry    Statistician;Interpersonal Relationship;Shop;Occupation;Cleaning    Stability/Clinical Decision Making Stable/Uncomplicated    Clinical Decision Making Low    Rehab Potential Excellent    PT Frequency 1x /  week    PT Duration 6 weeks    PT Treatment/Interventions ADLs/Self Care Home Management;Cryotherapy;Ultrasound;Traction;Moist Heat;Functional mobility training;Manual techniques;Dry needling;Passive range of motion;Neuromuscular re-education;Therapeutic  exercise;Taping;Therapeutic activities;Electrical Stimulation    PT Next Visit Plan core, posture in standing , lifting and hip hinging    PT Home Exercise Plan cervical ROM, piriformis stretch, childs pose, PPT/TrA and bent knee fall out    Consulted and Agree with Plan of Care Patient           Patient will benefit from skilled therapeutic intervention in order to improve the following deficits and impairments:  Increased fascial restricitons, Pain, Impaired sensation, Postural dysfunction, Decreased mobility, Decreased range of motion, Decreased strength, Impaired UE functional use, Impaired flexibility, Difficulty walking  Visit Diagnosis: Cervicalgia  Acute left-sided low back pain with sciatica, sciatica laterality unspecified  Muscle weakness (generalized)     Problem List Patient Active Problem List   Diagnosis Date Noted  . Fibroid uterus 06/10/2019  . Excessive bleeding in premenopausal period 04/05/2019  . Vulvar lesion 04/05/2019  . Dysmenorrhea 04/05/2019    Calandria Mullings 02/13/2020, 1:03 PM  [facility] [address] [place] [place], [place], [address] Phone: [phone]   Fax:  [phone]  Name: Alletta Mattos MRN: [id number] Date of Birth: [birth year]  Raeford Razor, PT 02/13/20 1:03 PM Phone: [phone] Fax: [phone]

## 2020-02-25 ENCOUNTER — Encounter: Payer: Self-pay | Admitting: Physical Therapy

## 2020-02-25 ENCOUNTER — Other Ambulatory Visit: Payer: Self-pay

## 2020-02-25 ENCOUNTER — Ambulatory Visit: Payer: Medicaid Other | Admitting: Physical Therapy

## 2020-02-25 DIAGNOSIS — M6281 Muscle weakness (generalized): Secondary | ICD-10-CM

## 2020-02-25 DIAGNOSIS — M542 Cervicalgia: Secondary | ICD-10-CM | POA: Diagnosis not present

## 2020-02-25 DIAGNOSIS — M5440 Lumbago with sciatica, unspecified side: Secondary | ICD-10-CM

## 2020-02-25 NOTE — Therapy (Signed)
[place], [place], [address] Phone: [phone]   Fax:  [phone]  Physical Therapy Treatment  Patient Details  Name: Linda Price MRN: [id number] Date of Birth: [birth year] Referring Provider (PT): Ophelia Charter    Encounter Date: 02/25/2020   PT End of Session - 02/25/20 1820    Visit Number 5    Number of Visits 15    Date for PT Re-Evaluation 03/10/20    Authorization Type MCD    Authorization - Visit Number 5    Authorization - Number of Visits 27    PT Start Time 6962    PT Stop Time 1803    PT Time Calculation (min) 46 min    Activity Tolerance Patient tolerated treatment well    Behavior During Therapy [facility] for tasks assessed/performed           History reviewed. No pertinent past medical history.  Past Surgical History:  Procedure Laterality Date  . CESAREAN SECTION      There were no vitals filed for this visit.   Subjective Assessment - 02/25/20 1818    Subjective Left side still doing better after dry needling but now noting right side upper trapezius region pain exacerbated. Continues with midline lower lumbar pain worse in AM, still with some intermittent radiating symptoms but primary pain local.                             [facility] Adult PT Treatment/Exercise - 02/25/20 0001      Neck Exercises: Supine   Neck Retraction 20 reps      Lumbar Exercises: Supine   Ab Set 10 reps    AB Set Limitations with pelvic tilt    Bent Knee Raise 10 reps    Bent Knee Raise Limitations with posterior prlvic tilt    Bridge 10 reps      Manual Therapy   Manual Therapy Manual Traction    Joint Mobilization hip LAD grade I-III oscillations    performed bilaterally   Manual Traction cervical manual traction and suboccipital release      Neck Exercises: Stretches   Upper Trapezius Stretch Right;Left;2 reps;30 seconds    Levator Stretch Right;Left;2 reps;30 seconds             Trigger Point Dry Needling - 02/25/20 0001    Consent Given? Yes    Education Handout Provided Previously provided    Muscles Treated Head and Neck Upper trapezius;Levator scapulae   bilat.   Muscles Treated Back/Hip Erector spinae;Lumbar multifidi   L4-5 region bilaterally   Dry Needling Comments needling in prone with 32 gauge 30 mm needles for upper trapezius and levator and 32 gauge 50 mm needles for lumbar muscles    Electrical Stimulation Performed with Dry Needling Yes    E-stim with Dry Needling Details TENS 20 pps to bilateral upper trapezius and levator region x 10 min                PT Education - 02/25/20 1820    Education Details Dry needling    Person(s) Educated Patient    Methods Explanation    Comprehension Verbalized understanding            PT Short Term Goals - 02/13/20 0921      PT SHORT TERM GOAL #1   Title Pt will be I with HEP for cervical ROM and lumbar  Baseline not consistent    Status On-going      PT SHORT TERM GOAL #2   Title Pt will understand and demo corrected posture and body mechanics as it relates to her job.    Status Achieved             PT Long Term Goals - 02/13/20 8144      PT LONG TERM GOAL #1   Title Pt will show normal AROM of cervical spine without increased pain    Baseline see flow sheets    Status On-going      PT LONG TERM GOAL #2   Title Pt will be able to return to work full duty without limitation of pain for up to 8 hours    Baseline has noticed less pain with work, still can be moderate (5 hours)    Status On-going      PT LONG TERM GOAL #3   Title Pt will be able to report no further L UE abnormal sensation.    Baseline improved , has not noticed as much    Status On-going      PT LONG TERM GOAL #4   Title Pt will increase UE strength to 4+/5 bilateral for maximal shoulder support while lifting.    Baseline improved see flowsheet    Status On-going                 Plan -  02/25/20 1821    Clinical Impression Statement Continue dry needling for cervical region and included for lumbar region today as well. Included cervical stretches and brief supine lumbar exercises but limited due to time constraints. Tried estim today with dry needling so will await further tx. response by next session and continue dry needling to cervical vs. lumbar region as found beneficial. Progress with therapy goals still ongoing.    Personal Factors and Comorbidities Behavior Pattern;Comorbidity 1;Profession    Comorbidities smoking, previous sciatica    Examination-Activity Limitations Lift;Stand;Locomotion Level;Bend;Reach Overhead;Carry    Statistician;Interpersonal Relationship;Shop;Occupation;Cleaning    Stability/Clinical Decision Making Stable/Uncomplicated    Clinical Decision Making Low    Rehab Potential Excellent    PT Frequency 1x / week    PT Duration 6 weeks    PT Treatment/Interventions ADLs/Self Care Home Management;Cryotherapy;Ultrasound;Traction;Moist Heat;Functional mobility training;Manual techniques;Dry needling;Passive range of motion;Neuromuscular re-education;Therapeutic exercise;Taping;Therapeutic activities;Electrical Stimulation    PT Next Visit Plan core, posture in standing , lifting and hip hinging, further dry needling prn    PT Home Exercise Plan cervical ROM, piriformis stretch, childs pose, PPT/TrA and bent knee fall out    Consulted and Agree with Plan of Care Patient           Patient will benefit from skilled therapeutic intervention in order to improve the following deficits and impairments:  Increased fascial restricitons, Pain, Impaired sensation, Postural dysfunction, Decreased mobility, Decreased range of motion, Decreased strength, Impaired UE functional use, Impaired flexibility, Difficulty walking  Visit Diagnosis: Cervicalgia  Acute left-sided low back pain with sciatica, sciatica laterality  unspecified  Muscle weakness (generalized)     Problem List Patient Active Problem List   Diagnosis Date Noted  . Fibroid uterus 06/10/2019  . Excessive bleeding in premenopausal period 04/05/2019  . Vulvar lesion 04/05/2019  . Dysmenorrhea 04/05/2019    Beaulah Dinning, PT, DPT 02/25/20 6:25 PM  [facility] [address] [place], [place], [address] Phone: [phone]   Fax:  [phone]  Name: Linda Price  MRN: [id number] Date of Birth: August 27, 1965

## 2020-03-05 ENCOUNTER — Encounter: Payer: Self-pay | Admitting: Physical Therapy

## 2020-03-05 ENCOUNTER — Other Ambulatory Visit: Payer: Self-pay

## 2020-03-05 ENCOUNTER — Ambulatory Visit: Payer: Medicaid Other | Admitting: Physical Therapy

## 2020-03-05 DIAGNOSIS — M542 Cervicalgia: Secondary | ICD-10-CM | POA: Diagnosis not present

## 2020-03-05 DIAGNOSIS — M6281 Muscle weakness (generalized): Secondary | ICD-10-CM

## 2020-03-05 DIAGNOSIS — M5440 Lumbago with sciatica, unspecified side: Secondary | ICD-10-CM

## 2020-03-05 NOTE — Therapy (Signed)
[place], [place], [address] Phone: [phone]   Fax:  [phone]  Physical Therapy Treatment  Patient Details  Name: Linda Price MRN: [id number] Date of Birth: Dec 08, 1965 Referring Provider (PT): Ophelia Charter    Encounter Date: 03/05/2020   PT End of Session - 03/05/20 1631    Visit Number 6    Number of Visits 15    Date for PT Re-Evaluation 03/10/20    Authorization Type MCD    Authorization - Visit Number 6    Authorization - Number of Visits 27    PT Start Time 1632    PT Stop Time 5465    PT Time Calculation (min) 43 min    Activity Tolerance Patient tolerated treatment well    Behavior During Therapy [facility] for tasks assessed/performed           History reviewed. No pertinent past medical history.  Past Surgical History:  Procedure Laterality Date   CESAREAN SECTION      There were no vitals filed for this visit.   Subjective Assessment - 03/05/20 1635    Subjective " the DN helped with the E-stim, I still get some stiffness in the neck but the back is giving me more issues."    Diagnostic tests XR spine:Degenerative disc disease of the lower lumbar spine,Degenerative disc and degenerative joint disease at C5-6 and C6-7resulting in bilateral neural foraminal narrowing, most severe onthe left at C6-7.    Currently in Pain? Yes    Pain Score 2     Pain Location Neck    Pain Orientation Right;Left    Pain Descriptors / Indicators Tightness    Pain Type Chronic pain    Pain Onset More than a month ago    Pain Score 5    Pain Location Back    Pain Orientation Mid;Lower    Pain Descriptors / Indicators Aching    Pain Type Chronic pain    Aggravating Factors  bending, pushing, lifting walking    Pain Relieving Factors rest, meds, stretches              [facility] PT Assessment - 03/05/20 0001      Assessment   Medical Diagnosis neck and back pain     Referring Provider (PT) Ophelia Charter                          [facility] Adult PT Treatment/Exercise - 03/05/20 0001      Lumbar Exercises: Stretches   Lower Trunk Rotation Limitations  2 x 20      Lumbar Exercises: Aerobic   Nustep L5 x 5 min UE/LE      Lumbar Exercises: Supine   Ab Set 10 reps   holding 10 seconds, cues to palpate and asses TRAcontraction   AB Set Limitations with pelvic tilt   tactile cues for proper form   Bent Knee Raise 10 reps   x 2 sets   Bent Knee Raise Limitations with posterior prlvic tilt    Dead Bug 5 reps   10 seconds with posterior pelvic tilt   Dead Bug Limitations halted due to increased soreness located at TFL      Manual Therapy   Manual therapy comments skilled palpation and monitoring of pt throghout TPDN   pt opted to focus on back vs neck with TPDN   Joint Mobilization hip LAD grade I-III oscillations, L1-L5  Soft tissue mobilization IASTM along bil lumbar paraspinals, upper trap/ levator scapulae    Manual Traction cervical manual traction and suboccipital release      Neck Exercises: Stretches   Upper Trapezius Stretch 1 rep;30 seconds;Left;Right    Levator Stretch 30 seconds;1 rep;Left;Right            Trigger Point Dry Needling - 03/05/20 0001    Consent Given? Yes    Education Handout Provided Previously provided    Muscles Treated Back/Hip Lumbar multifidi    E-stim with Dry Needling Details TENS 20 pps to bil lumbar multfidi, increasing intensity intermittently    Lumbar multifidi Response Twitch response elicited;Palpable increased muscle length   bil                 PT Short Term Goals - 02/13/20 0921      PT SHORT TERM GOAL #1   Title Pt will be I with HEP for cervical ROM and lumbar    Baseline not consistent    Status On-going      PT SHORT TERM GOAL #2   Title Pt will understand and demo corrected posture and body mechanics as it relates to her job.    Status Achieved             PT Long Term Goals - 02/13/20 4580        PT LONG TERM GOAL #1   Title Pt will show normal AROM of cervical spine without increased pain    Baseline see flow sheets    Status On-going      PT LONG TERM GOAL #2   Title Pt will be able to return to work full duty without limitation of pain for up to 8 hours    Baseline has noticed less pain with work, still can be moderate (5 hours)    Status On-going      PT LONG TERM GOAL #3   Title Pt will be able to report no further L UE abnormal sensation.    Baseline improved , has not noticed as much    Status On-going      PT LONG TERM GOAL #4   Title Pt will increase UE strength to 4+/5 bilateral for maximal shoulder support while lifting.    Baseline improved see flowsheet    Status On-going                 Plan - 03/05/20 1719    Clinical Impression Statement pt notes the neck doing better but continues to report increased lo wback pain. focused TPDN along bil lumbar multifidi combined with E-Stim followed with IASTM techniques and PA mobs. continued working on core strengthening in supine gradually increasing difficulty of execise. end of session she reported decreased pain and stiffnes in the low back.    PT Treatment/Interventions ADLs/Self Care Home Management;Cryotherapy;Ultrasound;Traction;Moist Heat;Functional mobility training;Manual techniques;Dry needling;Passive range of motion;Neuromuscular re-education;Therapeutic exercise;Taping;Therapeutic activities;Electrical Stimulation    PT Next Visit Plan core, posture in standing , lifting and hip hinging, further dry needling prn    PT Home Exercise Plan cervical ROM, piriformis stretch, childs pose, PPT/TrA and bent knee fall out    Consulted and Agree with Plan of Care Patient           Patient will benefit from skilled therapeutic intervention in order to improve the following deficits and impairments:  Increased fascial restricitons, Pain, Impaired sensation, Postural dysfunction, Decreased mobility,  Decreased range of motion, Decreased strength, Impaired UE  functional use, Impaired flexibility, Difficulty walking  Visit Diagnosis: Cervicalgia  Acute left-sided low back pain with sciatica, sciatica laterality unspecified  Muscle weakness (generalized)     Problem List Patient Active Problem List   Diagnosis Date Noted   Fibroid uterus 06/10/2019   Excessive bleeding in premenopausal period 04/05/2019   Vulvar lesion 04/05/2019   Dysmenorrhea 04/05/2019   Starr Lake PT, DPT, LAT, ATC  03/05/20  5:25 PM      [facility] [address] [place], [place], [address] Phone: [phone]   Fax:  [phone]  Name: Linda Price MRN: [id number] Date of Birth: [birth year]

## 2020-03-09 ENCOUNTER — Encounter: Payer: Self-pay | Admitting: Physical Therapy

## 2020-03-09 ENCOUNTER — Other Ambulatory Visit: Payer: Self-pay

## 2020-03-09 ENCOUNTER — Ambulatory Visit: Payer: Medicaid Other | Attending: Orthopaedic Surgery | Admitting: Physical Therapy

## 2020-03-09 DIAGNOSIS — M5440 Lumbago with sciatica, unspecified side: Secondary | ICD-10-CM | POA: Diagnosis present

## 2020-03-09 DIAGNOSIS — M542 Cervicalgia: Secondary | ICD-10-CM | POA: Insufficient documentation

## 2020-03-09 DIAGNOSIS — M6281 Muscle weakness (generalized): Secondary | ICD-10-CM | POA: Insufficient documentation

## 2020-03-09 NOTE — Therapy (Signed)
[place], [place], [address] Phone: [phone]   Fax:  [phone]  Physical Therapy Treatment  Patient Details  Name: Linda Price MRN: [id number] Date of Birth: [birth year] Referring Provider (PT): Ophelia Charter    Encounter Date: 03/09/2020   PT End of Session - 03/09/20 1027    Visit Number 7    Number of Visits 15    Date for PT Re-Evaluation 03/10/20    Authorization Type MCD    Authorization - Visit Number 7    Authorization - Number of Visits 27    Progress Note Due on Visit 27    PT Start Time 1018    PT Stop Time 1058    PT Time Calculation (min) 40 min    Activity Tolerance Patient tolerated treatment well    Behavior During Therapy [facility] for tasks assessed/performed           History reviewed. No pertinent past medical history.  Past Surgical History:  Procedure Laterality Date  . CESAREAN SECTION      There were no vitals filed for this visit.   Subjective Assessment - 03/09/20 1021    Subjective " the DN helped alot for the back, it did take a day for it to go away. The back and neck are doing pretty good but I feel like my sciatica is acting up."    Diagnostic tests XR spine:Degenerative disc disease of the lower lumbar spine,Degenerative disc and degenerative joint disease at C5-6 and C6-7resulting in bilateral neural foraminal narrowing, most severe onthe left at C6-7.    Currently in Pain? Yes    Pain Score 3     Pain Location Neck    Pain Orientation Right;Left    Pain Descriptors / Indicators Aching    Pain Type Chronic pain    Pain Onset More than a month ago    Pain Frequency Intermittent    Aggravating Factors  working    Pain Relieving Factors resting, stretches    Multiple Pain Sites Yes    Pain Score 2    Pain Location Back    Pain Orientation Lower    Pain Descriptors / Indicators Aching    Pain Type Chronic pain    Pain Onset More than a month ago    Pain  Frequency Intermittent    Aggravating Factors  bending foward,    Pain Relieving Factors rest, meds, stretch              [facility] PT Assessment - 03/09/20 0001      Assessment   Medical Diagnosis neck and back pain     Referring Provider (PT) Ophelia Charter                          [facility] Adult PT Treatment/Exercise - 03/09/20 0001      Self-Care   Self-Care Other Self-Care Comments    Other Self-Care Comments  how to perform self trigger point release and tools that can assist with treatment.       Neck Exercises: Supine   Other Supine Exercise foam roll routine (using rolled towels), ceiling punches, horizontal abd/ adduction, alternating ceiling punches, x to y an dback stroke 2 x 10 ea.      Lumbar Exercises: Aerobic   Nustep L5 x 5 min UE/LE      Lumbar Exercises: Supine   Bent Knee Raise 10 reps    Dead  Bug 10 reps   5 seconds    Dead Bug Limitations cues to keep knees about hip width apart      Lumbar Exercises: Quadruped   Opposite Arm/Leg Raise Right arm/Left leg;Left arm/Right leg;10 reps      Neck Exercises: Stretches   Upper Trapezius Stretch Right;Left;1 rep;30 seconds    Levator Stretch 1 rep;Right;Left;30 seconds                  PT Education - 03/09/20 1026    Education Details benefits of self trigger point release for upper trap/ levator, and for the piriformis    Person(s) Educated Patient    Methods Explanation;Verbal cues    Comprehension Verbalized understanding;Verbal cues required            PT Short Term Goals - 02/13/20 0921      PT SHORT TERM GOAL #1   Title Pt will be I with HEP for cervical ROM and lumbar    Baseline not consistent    Status On-going      PT SHORT TERM GOAL #2   Title Pt will understand and demo corrected posture and body mechanics as it relates to her job.    Status Achieved             PT Long Term Goals - 02/13/20 1607      PT LONG TERM GOAL #1   Title Pt will show normal AROM of  cervical spine without increased pain    Baseline see flow sheets    Status On-going      PT LONG TERM GOAL #2   Title Pt will be able to return to work full duty without limitation of pain for up to 8 hours    Baseline has noticed less pain with work, still can be moderate (5 hours)    Status On-going      PT LONG TERM GOAL #3   Title Pt will be able to report no further L UE abnormal sensation.    Baseline improved , has not noticed as much    Status On-going      PT LONG TERM GOAL #4   Title Pt will increase UE strength to 4+/5 bilateral for maximal shoulder support while lifting.    Baseline improved see flowsheet    Status On-going                 Plan - 03/09/20 1100    Clinical Impression Statement Focused session today on teaching self trigge rpoint release techiques to promote home treatment. continued working on scapular thoracic mobility/ stability which she did well. end of session she noted decreaed pain/ stiffness in the neck.    PT Treatment/Interventions ADLs/Self Care Home Management;Cryotherapy;Ultrasound;Traction;Moist Heat;Functional mobility training;Manual techniques;Dry needling;Passive range of motion;Neuromuscular re-education;Therapeutic exercise;Taping;Therapeutic activities;Electrical Stimulation    PT Next Visit Plan ERO next session. core, posture in standing , lifting and hip hinging, further dry needling prn    PT Home Exercise Plan cervical ROM, piriformis stretch, childs pose, PPT/TrA and bent knee fall out           Patient will benefit from skilled therapeutic intervention in order to improve the following deficits and impairments:  Increased fascial restricitons, Pain, Impaired sensation, Postural dysfunction, Decreased mobility, Decreased range of motion, Decreased strength, Impaired UE functional use, Impaired flexibility, Difficulty walking  Visit Diagnosis: Cervicalgia  Acute left-sided low back pain with sciatica, sciatica  laterality unspecified  Muscle weakness (generalized)  Problem List Patient Active Problem List   Diagnosis Date Noted  . Fibroid uterus 06/10/2019  . Excessive bleeding in premenopausal period 04/05/2019  . Vulvar lesion 04/05/2019  . Dysmenorrhea 04/05/2019    Starr Lake PT, DPT, LAT, ATC  03/09/20  11:02 AM      [facility] [address] [place], [place], [address] Phone: [phone]   Fax:  [phone]  Name: Linda Price MRN: [id number] Date of Birth: 26-Oct-1965

## 2020-03-16 ENCOUNTER — Encounter: Payer: Medicaid Other | Admitting: Physical Therapy

## 2020-03-23 ENCOUNTER — Encounter: Payer: Self-pay | Admitting: Physical Therapy

## 2020-03-23 ENCOUNTER — Ambulatory Visit: Payer: Medicaid Other | Admitting: Physical Therapy

## 2020-03-23 ENCOUNTER — Other Ambulatory Visit: Payer: Self-pay

## 2020-03-23 DIAGNOSIS — M6281 Muscle weakness (generalized): Secondary | ICD-10-CM

## 2020-03-23 DIAGNOSIS — M542 Cervicalgia: Secondary | ICD-10-CM | POA: Diagnosis not present

## 2020-03-23 DIAGNOSIS — M5440 Lumbago with sciatica, unspecified side: Secondary | ICD-10-CM

## 2020-03-23 NOTE — Therapy (Signed)
[place], [place], [address] Phone: [phone]   Fax:  [phone]  Physical Therapy Treatment / Re-certification  Patient Details  Name: Linda Price MRN: [id number] Date of Birth: 06-18-1965 Referring Provider (PT): Ophelia Charter    Encounter Date: 03/23/2020   PT End of Session - 03/23/20 1648    Visit Number 8    Number of Visits 15    Date for PT Re-Evaluation 04/27/20    Authorization Type MCD    Authorization - Visit Number 8    Authorization - Number of Visits 27    PT Start Time 1448   pt arrived 17 min late   PT Stop Time 1719    PT Time Calculation (min) 31 min    Activity Tolerance Patient tolerated treatment well    Behavior During Therapy [facility] for tasks assessed/performed           History reviewed. No pertinent past medical history.  Past Surgical History:  Procedure Laterality Date  . CESAREAN SECTION      There were no vitals filed for this visit.   Subjective Assessment - 03/23/20 1649    Subjective ' Things are going pretty good. my shoulders are feeling better. my Sciatica is acting up again and my back is bothering me about 5-6/10."    Diagnostic tests XR spine:Degenerative disc disease of the lower lumbar spine,Degenerative disc and degenerative joint disease at C5-6 and C6-7resulting in bilateral neural foraminal narrowing, most severe onthe left at C6-7.    Currently in Pain? Yes    Pain Score 0-No pain    Pain Onset More than a month ago    Aggravating Factors  working    Pain Score 6    Pain Location Back    Pain Orientation Right    Pain Descriptors / Indicators Aching    Pain Type Chronic pain    Pain Onset More than a month ago    Pain Frequency Intermittent    Aggravating Factors  working, bending forward.              [facility] PT Assessment - 03/23/20 0001      Assessment   Medical Diagnosis neck and back pain     Referring Provider (PT) Ophelia Charter        Strength   Right Shoulder Flexion 4+/5    Right Shoulder ABduction 5/5    Right Shoulder Internal Rotation 5/5    Right Shoulder External Rotation 4+/5    Left Shoulder Flexion 4-/5    Left Shoulder ABduction 4/5    Left Shoulder Internal Rotation 5/5    Left Shoulder External Rotation 4+/5    Right Hip ABduction 4-/5                         [facility] Adult PT Treatment/Exercise - 03/23/20 0001      Lumbar Exercises: Stretches   ITB Stretch Right;1 rep;20 seconds    Figure 4 Stretch 30 seconds;1 rep      Lumbar Exercises: Seated   Other Seated Lumbar Exercises seated hip ER RLE only 1 x 10 with blue band      Lumbar Exercises: Sidelying   Other Sidelying Lumbar Exercises R sidelying R hip external roation 2 x 15 unweighted      Manual Therapy   Manual therapy comments MTPR along R piriformis    Joint Mobilization L sacral ala mobs grade IV  Manual Traction tack and stretch of the R piriformis                  PT Education - 03/23/20 1725    Education Details Reviewed anatomy of the area involved. Reviewed HEP and updated for hip ER strengthening. Discussed POC moving forward.    Person(s) Educated Patient    Methods Explanation;Verbal cues;Handout    Comprehension Verbalized understanding            PT Short Term Goals - 03/23/20 1651      PT SHORT TERM GOAL #1   Title Pt will be I with HEP for cervical ROM and lumbar    Period Weeks    Status Achieved      PT SHORT TERM GOAL #2   Title Pt will understand and demo corrected posture and body mechanics as it relates to her job.    Period Weeks    Status Achieved    Target Date 02/11/20             PT Long Term Goals - 03/23/20 1654      PT LONG TERM GOAL #1   Title Pt will show normal AROM of cervical spine without increased pain    Period Weeks    Status Achieved      PT LONG TERM GOAL #2   Title Pt will be able to return to work full duty without limitation of pain for up  to 8 hours    Baseline only working about 6 hours, at end of shift pain increases to 4-5/10    Period Weeks    Status On-going      PT LONG TERM GOAL #3   Title Pt will be able to report no further L UE abnormal sensation.    Period Weeks    Status Achieved      PT LONG TERM GOAL #4   Title Pt will increase UE strength to 4+/5 bilateral for maximal shoulder support while lifting.    Baseline 4-/5 for shoulder flexion see flowsheet    Period Weeks    Status On-going      PT LONG TERM GOAL #5   Title pt to report no pain inthe R hip and no referred RLE symtoms for >/= 1 week for improvement in condition    Baseline R piriformis stiffness and pain with assoicated weakness.    Time 4    Period Weeks    Status New    Target Date 04/20/20                 Plan - 03/23/20 1726    Clinical Impression Statement pt arrives to PT today reporting improvement in neck pain noting only stiffnes but continues R low back and piriformis pain that inscreased with standing and improrves slightly with walking. She is making excellent progress toward her goals. limited session today due to pt running late. Focused on the R hip/ low back which revealed priformis and abductor weakness and concordant symtpoms. She responded well to manual trigger point release and R sacral ala mobs followed with piriformis strengthening. end of session she noted relief of pain and no referred symptoms. pt would from physical therapy to reduce R hip weakness and pain, improve gross strength and maximize function by addressing the deficits listed.    Rehab Potential Excellent    PT Frequency 1x / week    PT Duration --   5 weeks   PT Treatment/Interventions ADLs/Self  Care Home Management;Cryotherapy;Ultrasound;Traction;Moist Heat;Functional mobility training;Manual techniques;Dry needling;Passive range of motion;Neuromuscular re-education;Therapeutic exercise;Taping;Therapeutic activities;Electrical Stimulation    PT Next  Visit Plan , posture in standing , lifting and hip hinging, functional endurance strenghtening hip abductors/ extenors and external rotation. further dry needling prn    PT Home Exercise Plan cervical ROM, piriformis stretch, childs pose, PPT/TrA and bent knee fall out, R hip ER in R sidelying    Consulted and Agree with Plan of Care Patient           Patient will benefit from skilled therapeutic intervention in order to improve the following deficits and impairments:  Increased fascial restricitons, Pain, Impaired sensation, Postural dysfunction, Decreased mobility, Decreased range of motion, Decreased strength, Impaired UE functional use, Impaired flexibility, Difficulty walking  Visit Diagnosis: Cervicalgia  Acute left-sided low back pain with sciatica, sciatica laterality unspecified  Muscle weakness (generalized)     Problem List Patient Active Problem List   Diagnosis Date Noted  . Fibroid uterus 06/10/2019  . Excessive bleeding in premenopausal period 04/05/2019  . Vulvar lesion 04/05/2019  . Dysmenorrhea 04/05/2019   Starr Lake PT, DPT, LAT, ATC  03/23/20  5:37 PM      [place] [facility] [address] [place], [place], [address] Phone: [phone]   Fax:  [phone]  Name: Linda Price MRN: [id number] Date of Birth: July 03, 1965

## 2020-03-31 ENCOUNTER — Other Ambulatory Visit: Payer: Self-pay

## 2020-03-31 ENCOUNTER — Ambulatory Visit: Payer: Medicaid Other | Admitting: Physical Therapy

## 2020-03-31 ENCOUNTER — Encounter: Payer: Self-pay | Admitting: Physical Therapy

## 2020-03-31 DIAGNOSIS — M542 Cervicalgia: Secondary | ICD-10-CM

## 2020-03-31 DIAGNOSIS — M6281 Muscle weakness (generalized): Secondary | ICD-10-CM

## 2020-03-31 DIAGNOSIS — M5440 Lumbago with sciatica, unspecified side: Secondary | ICD-10-CM

## 2020-03-31 NOTE — Therapy (Signed)
[place], [place], [address] Phone: [phone]   Fax:  [phone]  Physical Therapy Treatment  Patient Details  Name: Linda Price MRN: [id number] Date of Birth: 03-10-1966 Referring Provider (PT): Ophelia Charter    Encounter Date: 03/31/2020   PT End of Session - 03/31/20 1801    Visit Number 9    Number of Visits 15    Date for PT Re-Evaluation 04/27/20    Authorization Type MCD    Authorization - Visit Number 9    Authorization - Number of Visits 27    PT Start Time 9924    PT Stop Time 1809    PT Time Calculation (min) 55 min    Activity Tolerance Patient tolerated treatment well    Behavior During Therapy [facility] for tasks assessed/performed           History reviewed. No pertinent past medical history.  Past Surgical History:  Procedure Laterality Date  . CESAREAN SECTION      There were no vitals filed for this visit.   Subjective Assessment - 03/31/20 1716    Subjective Last session was helpful for piriformis region pain and has been using tennis ball for muscle release. Primary issue today is LBP. Neck not too bad today.                             [facility] Adult PT Treatment/Exercise - 03/31/20 0001      Lumbar Exercises: Stretches   Passive Hamstring Stretch Right    Passive Hamstring Stretch Limitations 1x 30 sec , attempted second rep but held due to TFL region pain    Lower Trunk Rotation Limitations brief holds x 15 reps ea. way     ITB Stretch Right;2 reps;30 seconds    Figure 4 Stretch 3 reps;30 seconds   right     Lumbar Exercises: Supine   Pelvic Tilt 15 reps    Bent Knee Raise 15 reps    Dead Bug 15 reps    Dead Bug Limitations 1 lb. weight each UE    Bridge 15 reps    Bridge with Ball Squeeze 15 reps    Isometric Hip Flexion 10 reps    Isometric Hip Flexion Limitations 2-3 sec holds      Manual Therapy   Soft tissue mobilization STM right TFL  and gluteus medius region            Trigger Point Dry Needling - 03/31/20 0001    Consent Given? Yes    Education Handout Provided Previously provided    Muscles Treated Back/Hip Gluteus medius;Piriformis;Tensor fascia lata;Erector spinae;Lumbar multifidi    Dry Needling Comments brief needling of right TFL, gluteus medius and piriformis in left sidelying then needling in prone to bilat. lumbar longissimus and multifidi at L4-5 levels    E-stim with Dry Needling Details TENS 2 pps x 10 min lumbar region only                  PT Short Term Goals - 03/23/20 1651      PT SHORT TERM GOAL #1   Title Pt will be I with HEP for cervical ROM and lumbar    Period Weeks    Status Achieved      PT SHORT TERM GOAL #2   Title Pt will understand and demo corrected posture and body mechanics as it relates to her job.  Period Weeks    Status Achieved    Target Date 02/11/20             PT Long Term Goals - 03/23/20 1654      PT LONG TERM GOAL #1   Title Pt will show normal AROM of cervical spine without increased pain    Period Weeks    Status Achieved      PT LONG TERM GOAL #2   Title Pt will be able to return to work full duty without limitation of pain for up to 8 hours    Baseline only working about 6 hours, at end of shift pain increases to 4-5/10    Period Weeks    Status On-going      PT LONG TERM GOAL #3   Title Pt will be able to report no further L UE abnormal sensation.    Period Weeks    Status Achieved      PT LONG TERM GOAL #4   Title Pt will increase UE strength to 4+/5 bilateral for maximal shoulder support while lifting.    Baseline 4-/5 for shoulder flexion see flowsheet    Period Weeks    Status On-going      PT LONG TERM GOAL #5   Title pt to report no pain inthe R hip and no referred RLE symtoms for >/= 1 week for improvement in condition    Baseline R piriformis stiffness and pain with assoicated weakness.    Time 4    Period Weeks     Status New    Target Date 04/20/20                 Plan - 03/31/20 1801    Clinical Impression Statement Focus lumbar region with tx. today with continued core/lumbar stabilization focus. Overall exercises well-tolerated but did have some rigth TFL region discomfort with attempted stretches and also brief hamstring cramping with hip bridge exercise. Continued dry needling given past benefit wih this and including both hip and lumbar regions. Progress for therapy goals ongoing.    Personal Factors and Comorbidities Behavior Pattern;Comorbidity 1;Profession    Comorbidities smoking, previous sciatica    Examination-Activity Limitations Lift;Stand;Locomotion Level;Bend;Reach Overhead;Carry    Statistician;Interpersonal Relationship;Shop;Occupation;Cleaning    Stability/Clinical Decision Making Stable/Uncomplicated    Clinical Decision Making Low    PT Frequency 1x / week    PT Duration --   5 weeks   PT Treatment/Interventions ADLs/Self Care Home Management;Cryotherapy;Ultrasound;Traction;Moist Heat;Functional mobility training;Manual techniques;Dry needling;Passive range of motion;Neuromuscular re-education;Therapeutic exercise;Taping;Therapeutic activities;Electrical Stimulation    PT Next Visit Plan tx. focus lumbar vs. cervical region pending region of symptoms, address right hip musculature prn with TFL/lateral hip and external rotators, continue strengthening and stabilization progression as tolerated    PT Home Exercise Plan cervical ROM, piriformis stretch, childs pose, PPT/TrA and bent knee fall out, R hip ER in R sidelying    Consulted and Agree with Plan of Care Patient           Patient will benefit from skilled therapeutic intervention in order to improve the following deficits and impairments:  Increased fascial restricitons, Pain, Impaired sensation, Postural dysfunction, Decreased mobility, Decreased range of motion, Decreased  strength, Impaired UE functional use, Impaired flexibility, Difficulty walking  Visit Diagnosis: Cervicalgia  Acute left-sided low back pain with sciatica, sciatica laterality unspecified  Muscle weakness (generalized)     Problem List Patient Active Problem List   Diagnosis Date Noted  . Fibroid  uterus 06/10/2019  . Excessive bleeding in premenopausal period 04/05/2019  . Vulvar lesion 04/05/2019  . Dysmenorrhea 04/05/2019    Beaulah Dinning, PT, DPT 03/31/20 6:17 PM  [place] [facility] [address] [place], [place], [address] Phone: [phone]   Fax:  [phone]  Name: Linda Price MRN: [id number] Date of Birth: 1965/08/23

## 2020-04-07 ENCOUNTER — Ambulatory Visit: Payer: Medicaid Other | Attending: Orthopaedic Surgery | Admitting: Physical Therapy

## 2020-04-07 ENCOUNTER — Other Ambulatory Visit: Payer: Self-pay

## 2020-04-07 ENCOUNTER — Encounter: Payer: Self-pay | Admitting: Physical Therapy

## 2020-04-07 DIAGNOSIS — M5440 Lumbago with sciatica, unspecified side: Secondary | ICD-10-CM | POA: Diagnosis present

## 2020-04-07 DIAGNOSIS — M6281 Muscle weakness (generalized): Secondary | ICD-10-CM | POA: Diagnosis present

## 2020-04-07 DIAGNOSIS — M542 Cervicalgia: Secondary | ICD-10-CM | POA: Insufficient documentation

## 2020-04-07 NOTE — Therapy (Signed)
[place], [place], [address] Phone: [phone]   Fax:  [phone]  Physical Therapy Treatment  Patient Details  Name: Linda Price MRN: [id number] Date of Birth: August 12, 1965 Referring Provider (PT): Ophelia Charter    Encounter Date: 04/07/2020   PT End of Session - 04/07/20 1809    Visit Number 10    Number of Visits 15    Date for PT Re-Evaluation 04/27/20    Authorization Type MCD    Authorization - Visit Number 10    Authorization - Number of Visits 27    PT Start Time 3976    PT Stop Time 1804    PT Time Calculation (min) 49 min    Activity Tolerance Patient tolerated treatment well    Behavior During Therapy [facility] for tasks assessed/performed           History reviewed. No pertinent past medical history.  Past Surgical History:  Procedure Laterality Date  . CESAREAN SECTION      There were no vitals filed for this visit.   Subjective Assessment - 04/07/20 1729    Subjective Good response to last tx. session with focus on lumbar region. Primary concern today is neck region with bilateral upper trapezius and upper rhomboid region discomfort. No recent left UE radiating symptoms/numbness noted.    Pertinent History Rt sided fibroid, R sciatica.    Diagnostic tests XR spine:Degenerative disc disease of the lower lumbar spine,Degenerative disc and degenerative joint disease at C5-6 and C6-7resulting in bilateral neural foraminal narrowing, most severe onthe left at C6-7.    Patient Stated Goals Pain relief, move better    Currently in Pain? Yes    Pain Score 4     Pain Location Neck    Pain Orientation Right;Left    Pain Descriptors / Indicators Aching    Pain Type Chronic pain    Pain Radiating Towards bilateral upper trapezius region    Pain Onset More than a month ago    Pain Frequency Intermittent    Aggravating Factors  stiff in AM, worse after work    Pain Relieving Factors rest,  stretches    Effect of Pain on Daily Activities impacts positional and activity tolerance              [facility] PT Assessment - 04/07/20 0001      AROM   Cervical - Right Rotation 72    Cervical - Left Rotation 70                         [facility] Adult PT Treatment/Exercise - 04/07/20 0001      Neck Exercises: Theraband   Shoulder Extension 15 reps;Green    Rows 15 reps;Blue      Neck Exercises: Seated   Cervical Isometrics Flexion;5 secs;15 reps      Manual Therapy   Joint Mobilization thoracic PA grade I-III    Soft tissue mobilization STM in prone bilateral middle + upper trapezius and upper rhomboids    Manual Traction cervical      Neck Exercises: Stretches   Upper Trapezius Stretch Right;Left;2 reps;30 seconds   supine manual stretch           Trigger Point Dry Needling - 04/07/20 0001    Consent Given? Yes    Education Handout Provided Previously provided    Muscles Treated Head and Neck Upper trapezius;Cervical multifidi   included also middle trapezius  Dry Needling Comments needling in prone with 30-32 gauge 30 mm needles    E-stim with Dry Needling Details TENS 20 pps x 10 minutes to bilateral upper trapezius                PT Education - 04/07/20 1809    Education Details exercises    Person(s) Educated Patient    Methods Explanation;Demonstration;Verbal cues    Comprehension Verbalized understanding;Returned demonstration            PT Short Term Goals - 03/23/20 1651      PT SHORT TERM GOAL #1   Title Pt will be I with HEP for cervical ROM and lumbar    Period Weeks    Status Achieved      PT SHORT TERM GOAL #2   Title Pt will understand and demo corrected posture and body mechanics as it relates to her job.    Period Weeks    Status Achieved    Target Date 02/11/20             PT Long Term Goals - 03/23/20 1654      PT LONG TERM GOAL #1   Title Pt will show normal AROM of cervical spine without increased pain      Period Weeks    Status Achieved      PT LONG TERM GOAL #2   Title Pt will be able to return to work full duty without limitation of pain for up to 8 hours    Baseline only working about 6 hours, at end of shift pain increases to 4-5/10    Period Weeks    Status On-going      PT LONG TERM GOAL #3   Title Pt will be able to report no further L UE abnormal sensation.    Period Weeks    Status Achieved      PT LONG TERM GOAL #4   Title Pt will increase UE strength to 4+/5 bilateral for maximal shoulder support while lifting.    Baseline 4-/5 for shoulder flexion see flowsheet    Period Weeks    Status On-going      PT LONG TERM GOAL #5   Title pt to report no pain inthe R hip and no referred RLE symtoms for >/= 1 week for improvement in condition    Baseline R piriformis stiffness and pain with assoicated weakness.    Time 4    Period Weeks    Status New    Target Date 04/20/20                 Plan - 04/07/20 1810    Clinical Impression Statement Tx. focus cervical region today with continued dry needling as well as postural strengthening, stretches and manual therapy. Improving cervical rotation AROM noted from previous status and good progress also with resolution of radicular symptoms. Status complicated by multiple tx. areas for referring dx and some tendency symptom exacerbation between visits but continues to show improvement from baseline status.    Personal Factors and Comorbidities Behavior Pattern;Comorbidity 1;Profession    Comorbidities smoking, previous sciatica    Examination-Activity Limitations Lift;Stand;Locomotion Level;Bend;Reach Overhead;Carry    Statistician;Interpersonal Relationship;Shop;Occupation;Cleaning    Stability/Clinical Decision Making Stable/Uncomplicated    Clinical Decision Making Low    Rehab Potential Excellent    PT Frequency 1x / week    PT Duration --   5 weeks   PT  Treatment/Interventions ADLs/Self Care Home Management;Cryotherapy;Ultrasound;Traction;Moist  Heat;Functional mobility training;Manual techniques;Dry needling;Passive range of motion;Neuromuscular re-education;Therapeutic exercise;Taping;Therapeutic activities;Electrical Stimulation    PT Next Visit Plan tx. focus lumbar vs. cervical region pending region of symptoms, address right hip musculature prn with TFL/lateral hip and external rotators, continue strengthening and stabilization progression as tolerated    PT Home Exercise Plan cervical ROM, piriformis stretch, childs pose, PPT/TrA and bent knee fall out, R hip ER in R sidelying    Consulted and Agree with Plan of Care Patient           Patient will benefit from skilled therapeutic intervention in order to improve the following deficits and impairments:  Increased fascial restricitons, Pain, Impaired sensation, Postural dysfunction, Decreased mobility, Decreased range of motion, Decreased strength, Impaired UE functional use, Impaired flexibility, Difficulty walking  Visit Diagnosis: Cervicalgia  Acute left-sided low back pain with sciatica, sciatica laterality unspecified  Muscle weakness (generalized)     Problem List Patient Active Problem List   Diagnosis Date Noted  . Fibroid uterus 06/10/2019  . Excessive bleeding in premenopausal period 04/05/2019  . Vulvar lesion 04/05/2019  . Dysmenorrhea 04/05/2019    Beaulah Dinning, PT, DPT 04/07/20 6:17 PM  [place] [facility] [address] [place], [place], [address] Phone: [phone]   Fax:  [phone]  Name: Linda Price MRN: [id number] Date of Birth: 1966-05-11

## 2020-04-14 ENCOUNTER — Encounter: Payer: Self-pay | Admitting: Physical Therapy

## 2020-04-14 ENCOUNTER — Other Ambulatory Visit: Payer: Self-pay

## 2020-04-14 ENCOUNTER — Ambulatory Visit: Payer: Medicaid Other | Admitting: Physical Therapy

## 2020-04-14 DIAGNOSIS — M542 Cervicalgia: Secondary | ICD-10-CM | POA: Diagnosis not present

## 2020-04-14 DIAGNOSIS — M5440 Lumbago with sciatica, unspecified side: Secondary | ICD-10-CM

## 2020-04-14 DIAGNOSIS — M6281 Muscle weakness (generalized): Secondary | ICD-10-CM

## 2020-04-14 NOTE — Therapy (Signed)
[place], [place], [address] Phone: [phone]   Fax:  [phone]  Physical Therapy Treatment  Patient Details  Name: Linda Price MRN: [id number] Date of Birth: [birth year] Referring Provider (PT): Ophelia Charter    Encounter Date: 04/14/2020   PT End of Session - 04/14/20 1638    Visit Number 11    Number of Visits 15    Date for PT Re-Evaluation 04/27/20    Authorization Type MCD    Authorization - Visit Number 11    Authorization - Number of Visits 27    Progress Note Due on Visit 27    PT Start Time 2671    PT Stop Time 1713    PT Time Calculation (min) 38 min    Activity Tolerance Patient tolerated treatment well    Behavior During Therapy [facility] for tasks assessed/performed           History reviewed. No pertinent past medical history.  Past Surgical History:  Procedure Laterality Date  . CESAREAN SECTION      There were no vitals filed for this visit.   Subjective Assessment - 04/14/20 1638    Subjective "I am doing pretty good, it is back to my sciatica again it keeps moving around. the neck is feeling pretty good some stiffness mostly inthe morning.              [facility] PT Assessment - 04/14/20 0001      Assessment   Medical Diagnosis neck and back pain     Referring Provider (PT) Ophelia Charter       Special Tests    Special Tests Leg LengthTest    Leg length test  True;Apparent      True   Right 84 in.    Left  85.4 in.      Apparent   Right 94.2 in.    Left 95 in.                         [place] Adult PT Treatment/Exercise - 04/14/20 0001      Self-Care   Other Self-Care Comments   benefits of heel lif tin the R shoe to promote proper pelvic equality      Lumbar Exercises: Stretches   Figure 4 Stretch 3 reps;30 seconds   rocking to the L      Lumbar Exercises: Seated   Sit to Stand 10 reps   x 2 sets with green band around the knees     Lumbar  Exercises: Sidelying   Hip Abduction Both   2 sets going to fatigue   Other Sidelying Lumbar Exercises hip ER in R sidelying 2 x 10                  PT Education - 04/14/20 1721    Education Details reviewed LLD assessement and benefits of heel lift in the R shoe and to start with wearing it 1 hour a day.    Person(s) Educated Patient    Methods Explanation;Verbal cues    Comprehension Verbalized understanding            PT Short Term Goals - 03/23/20 1651      PT SHORT TERM GOAL #1   Title Pt will be I with HEP for cervical ROM and lumbar    Period Weeks    Status Achieved      PT SHORT TERM GOAL #2  Title Pt will understand and demo corrected posture and body mechanics as it relates to her job.    Period Weeks    Status Achieved    Target Date 02/11/20             PT Long Term Goals - 03/23/20 1654      PT LONG TERM GOAL #1   Title Pt will show normal AROM of cervical spine without increased pain    Period Weeks    Status Achieved      PT LONG TERM GOAL #2   Title Pt will be able to return to work full duty without limitation of pain for up to 8 hours    Baseline only working about 6 hours, at end of shift pain increases to 4-5/10    Period Weeks    Status On-going      PT LONG TERM GOAL #3   Title Pt will be able to report no further L UE abnormal sensation.    Period Weeks    Status Achieved      PT LONG TERM GOAL #4   Title Pt will increase UE strength to 4+/5 bilateral for maximal shoulder support while lifting.    Baseline 4-/5 for shoulder flexion see flowsheet    Period Weeks    Status On-going      PT LONG TERM GOAL #5   Title pt to report no pain inthe R hip and no referred RLE symtoms for >/= 1 week for improvement in condition    Baseline R piriformis stiffness and pain with assoicated weakness.    Time 4    Period Weeks    Status New    Target Date 04/20/20                 Plan - 04/14/20 1716    Clinical Impression  Statement pt reports the neck is improving but notes increased R sciatica symptoms that continue to flucturate. Assessed LLD which reveaeld R LLD being shorter which she noted improvement with heel lift in standing, unable to test with walking due to pt having sandals. continued working on hip abductor/ extnesor strengthening which she noted significant relief of pain noting on pain at end of session.    PT Treatment/Interventions ADLs/Self Care Home Management;Cryotherapy;Ultrasound;Traction;Moist Heat;Functional mobility training;Manual techniques;Dry needling;Passive range of motion;Neuromuscular re-education;Therapeutic exercise;Taping;Therapeutic activities;Electrical Stimulation    PT Next Visit Plan ERO vs D/C? how was heel lift for R shoe?. tx. focus lumbar vs. cervical region pending region of symptoms, address right hip musculature prn with TFL/lateral hip and external rotators, continue strengthening and stabilization progression as tolerated    PT Home Exercise Plan cervical ROM, piriformis stretch, childs pose, PPT/TrA and bent knee fall out, R hip ER in R sidelying    Consulted and Agree with Plan of Care Patient           Patient will benefit from skilled therapeutic intervention in order to improve the following deficits and impairments:  Increased fascial restricitons, Pain, Impaired sensation, Postural dysfunction, Decreased mobility, Decreased range of motion, Decreased strength, Impaired UE functional use, Impaired flexibility, Difficulty walking  Visit Diagnosis: Cervicalgia  Acute left-sided low back pain with sciatica, sciatica laterality unspecified  Muscle weakness (generalized)     Problem List Patient Active Problem List   Diagnosis Date Noted  . Fibroid uterus 06/10/2019  . Excessive bleeding in premenopausal period 04/05/2019  . Vulvar lesion 04/05/2019  . Dysmenorrhea 04/05/2019   Starr [place] PT, DPT,  LAT, ATC  04/14/20  5:27 PM      [facility] [address] [place], [place], [address] Phone: [phone]   Fax:  [phone]  Name: Linda Price MRN: [id number] Date of Birth: 01/19/1966

## 2020-04-21 ENCOUNTER — Ambulatory Visit: Payer: Medicaid Other | Admitting: Physical Therapy

## 2020-04-28 ENCOUNTER — Ambulatory Visit: Payer: Medicaid Other | Admitting: Physical Therapy

## 2020-04-28 ENCOUNTER — Other Ambulatory Visit: Payer: Self-pay

## 2020-04-28 DIAGNOSIS — M5440 Lumbago with sciatica, unspecified side: Secondary | ICD-10-CM

## 2020-04-28 DIAGNOSIS — M542 Cervicalgia: Secondary | ICD-10-CM

## 2020-04-28 DIAGNOSIS — M6281 Muscle weakness (generalized): Secondary | ICD-10-CM

## 2020-04-29 ENCOUNTER — Encounter: Payer: Self-pay | Admitting: Physical Therapy

## 2020-04-29 NOTE — Therapy (Signed)
[place], [place], [address] Phone: [phone]   Fax:  [phone]  Physical Therapy Treatment/Discharge  Patient Details  Name: Kalijah Westfall MRN: [id number] Date of Birth: 03/13/1966 Referring Provider (PT): Ophelia Charter    Encounter Date: 04/28/2020   PT End of Session - 04/28/20 1718    Visit Number 12    Number of Visits 12    Date for PT Re-Evaluation 44/96/75   Recertification completed 04/28/20 for final visit   Authorization Type MCD    Authorization - Visit Number 12    Authorization - Number of Visits 27    Progress Note Due on Visit 27    PT Start Time 9163    PT Stop Time 1802    PT Time Calculation (min) 44 min    Activity Tolerance Patient tolerated treatment well    Behavior During Therapy [facility] for tasks assessed/performed           History reviewed. No pertinent past medical history.  Past Surgical History:  Procedure Laterality Date  . CESAREAN SECTION      There were no vitals filed for this visit.   Subjective Assessment - 04/29/20 1004    Subjective Pt. presents for her 12th therapy visit today. She rates improvement for chronic neck and LBP at 60-70% from baseline at eval. Primary complaint today is tightness in low back. Still with intermittent symptoms for both regions variable depending on the day. Plan d/c to HEP today for continued independent progress.    Pertinent History Rt sided fibroid, R sciatica.    Diagnostic tests XR spine:Degenerative disc disease of the lower lumbar spine,Degenerative disc and degenerative joint disease at C5-6 and C6-7resulting in bilateral neural foraminal narrowing, most severe onthe left at C6-7.                             [facility] Adult PT Treatment/Exercise - 04/29/20 0001      Neck Exercises: Seated   Other Seated Exercise HEP instruction/review cervical retractions, upper trap and pec stretches, Theraband row and  horizontal abduction      Lumbar Exercises: Supine   Other Supine Lumbar Exercises HEP review pelvic tilts, dead bug progression, hip bridge, clamshells      Manual Therapy   Joint Mobilization Lumbar CPAs grade I-III L3-5    Soft tissue mobilization STM lower lumbar paraspinals            Trigger Point Dry Needling - 04/29/20 0001    Consent Given? Yes    Education Handout Provided Previously provided    Muscles Treated Back/Hip Erector spinae;Lumbar multifidi   bilat. at L3-5   Dry Needling Comments needling in prone with 32 gauge 50 mm needles    E-stim with Dry Needling Details TENS 2 pps x 10 minutes                PT Education - 04/29/20 1010    Education Details HEP updates, POC    Person(s) Educated Patient    Methods Explanation    Comprehension Verbalized understanding            PT Short Term Goals - 03/23/20 1651      PT SHORT TERM GOAL #1   Title Pt will be I with HEP for cervical ROM and lumbar    Period Weeks    Status Achieved      PT SHORT TERM GOAL #  2   Title Pt will understand and demo corrected posture and body mechanics as it relates to her job.    Period Weeks    Status Achieved    Target Date 02/11/20             PT Long Term Goals - 04/29/20 1012      PT LONG TERM GOAL #1   Title Pt will show normal AROM of cervical spine without increased pain    Baseline met    Time 8    Period Weeks    Status Achieved      PT LONG TERM GOAL #2   Title Pt will be able to return to work full duty without limitation of pain for up to 8 hours    Baseline working 5-6 hours, pain 5-6/10 by end of shift    Time 8    Period Weeks    Status On-going      PT LONG TERM GOAL #3   Title Pt will be able to report no further L UE abnormal sensation.    Baseline improved , has not noticed as much    Time 8    Period Weeks    Status Achieved      PT LONG TERM GOAL #4   Title Pt will increase UE strength to 4+/5 bilateral for maximal shoulder  support while lifting.    Baseline see flowsheet    Time 8    Period Weeks    Status Partially Met      PT LONG TERM GOAL #5   Title pt to report no pain inthe R hip and no referred RLE symtoms for >/= 1 week for improvement in condition    Baseline Still with intermittent right hip symptoms    Time 4    Period Weeks    Status Not Met                 Plan - 04/29/20 1010    Clinical Impression Statement Pt. has attended 12 therapy sessions with 60-70% symptom improvement as noted in subjective. Still having intermittent symptoms but hopeful at this point that symptoms can be managed with continued HEP performance. Plan d/c to HEP and recommend MD follow up if experiencing any future changes in status.    Personal Factors and Comorbidities Behavior Pattern;Comorbidity 1;Profession    Comorbidities smoking, previous sciatica    Examination-Activity Limitations Lift;Stand;Locomotion Level;Bend;Reach Overhead;Carry    Statistician;Interpersonal Relationship;Shop;Occupation;Cleaning    Stability/Clinical Decision Making Stable/Uncomplicated    Clinical Decision Making Low    Rehab Potential Excellent    PT Frequency 1x / week    PT Duration --   5 weeks   PT Treatment/Interventions ADLs/Self Care Home Management;Cryotherapy;Ultrasound;Traction;Moist Heat;Functional mobility training;Manual techniques;Dry needling;Passive range of motion;Neuromuscular re-education;Therapeutic exercise;Taping;Therapeutic activities;Electrical Stimulation    PT Next Visit Plan NA    PT Home Exercise Plan Access code: [email or web address]    Consulted and Agree with Plan of Care Patient           Patient will benefit from skilled therapeutic intervention in order to improve the following deficits and impairments:  Increased fascial restricitons, Pain, Impaired sensation, Postural dysfunction, Decreased mobility, Decreased range of motion, Decreased strength, Impaired  UE functional use, Impaired flexibility, Difficulty walking  Visit Diagnosis: Cervicalgia  Acute left-sided low back pain with sciatica, sciatica laterality unspecified  Muscle weakness (generalized)     Problem List Patient Active Problem List   Diagnosis Date Noted  .  Fibroid uterus 06/10/2019  . Excessive bleeding in premenopausal period 04/05/2019  . Vulvar lesion 04/05/2019  . Dysmenorrhea 04/05/2019       PHYSICAL THERAPY DISCHARGE SUMMARY  Visits from Start of Care: 12  Current functional level related to goals / functional outcomes: See above   Remaining deficits: UE weakness, intermittent limitations of activity tolerance   Education / Equipment: HEP, issued red Theraband Plan: Patient agrees to discharge.  Patient goals were partially met. Patient is being discharged due to meeting the stated rehab goals.  ?????          Beaulah Dinning, PT, DPT 04/29/20 10:17 AM      [facility] [address] [place], [place], [address] Phone: [phone]   Fax:  [phone]  Name: Luv Mish MRN: [id number] Date of Birth: [birth year]

## 2020-05-22 ENCOUNTER — Ambulatory Visit: Payer: Medicaid Other | Attending: Internal Medicine

## 2020-05-22 DIAGNOSIS — Z23 Encounter for immunization: Secondary | ICD-10-CM

## 2020-05-22 NOTE — Progress Notes (Signed)
   Covid-19 Vaccination Clinic  Name:  Linda Price    MRN: [id number] DOB: 1966-03-19  05/22/2020  Linda Price was observed post Covid-19 immunization for 15 minutes without incident. She was provided with Vaccine Information Sheet and instruction to access the V-Safe system.   Linda Price was instructed to call 911 with any severe reactions post vaccine: Marland Kitchen Difficulty breathing  . Swelling of face and throat  . A fast heartbeat  . A bad rash all over body  . Dizziness and weakness   Immunizations Administered    Name Date Dose VIS Date Route   Pfizer COVID-19 Vaccine 05/22/2020  3:52 PM 0.3 mL 03/25/2020 Intramuscular   Manufacturer: [place]   Lot: [id number]   [place]: [address]-9

## 2021-10-23 IMAGING — CR DG LUMBAR SPINE COMPLETE 4+V
5 series · 5 of 5 positions shown · non-contrast
Comparison: None.

CLINICAL DATA: Left back pain

EXAM:
LUMBAR SPINE - COMPLETE 4+ VIEW

[t lumbar spine ap]
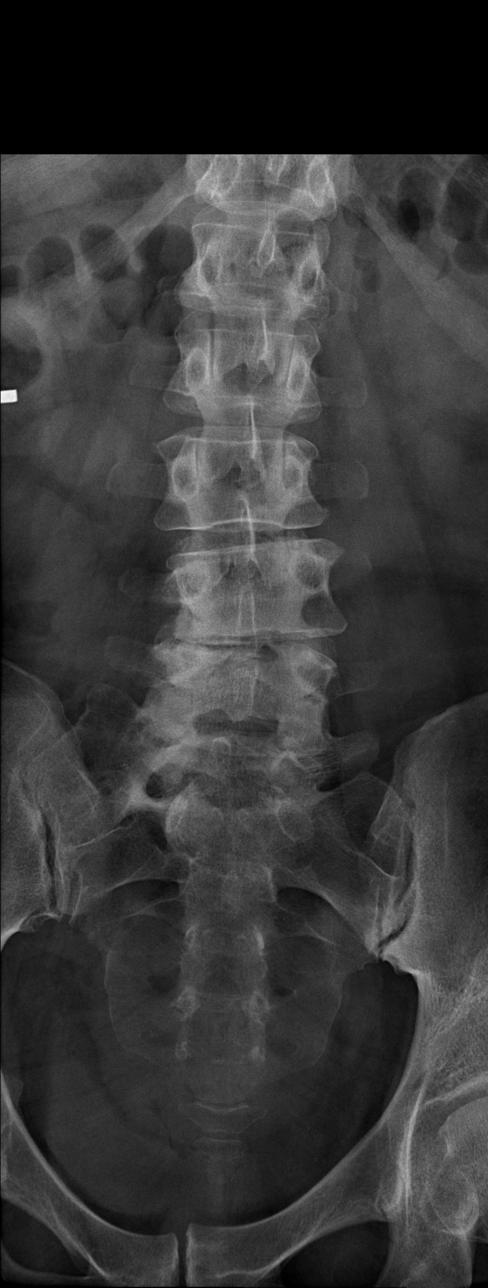

[t lumbar spine obl (1 of 2)]
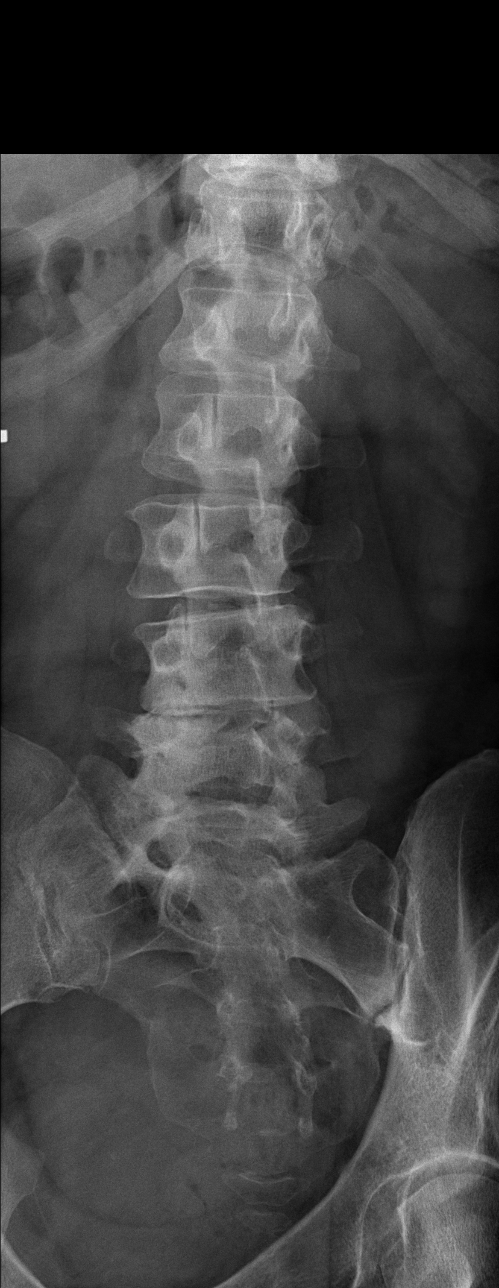

[t lumbar spine obl (2 of 2)]
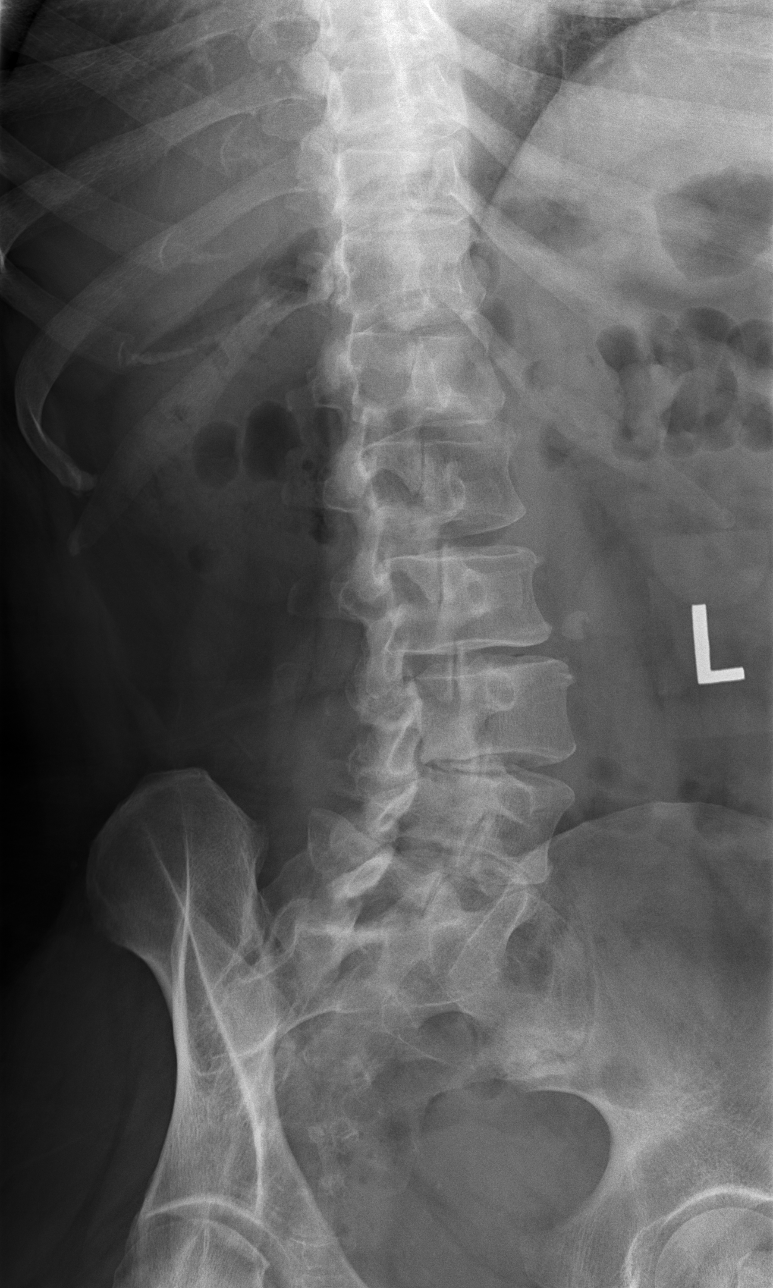

[t lumbar spine lat]
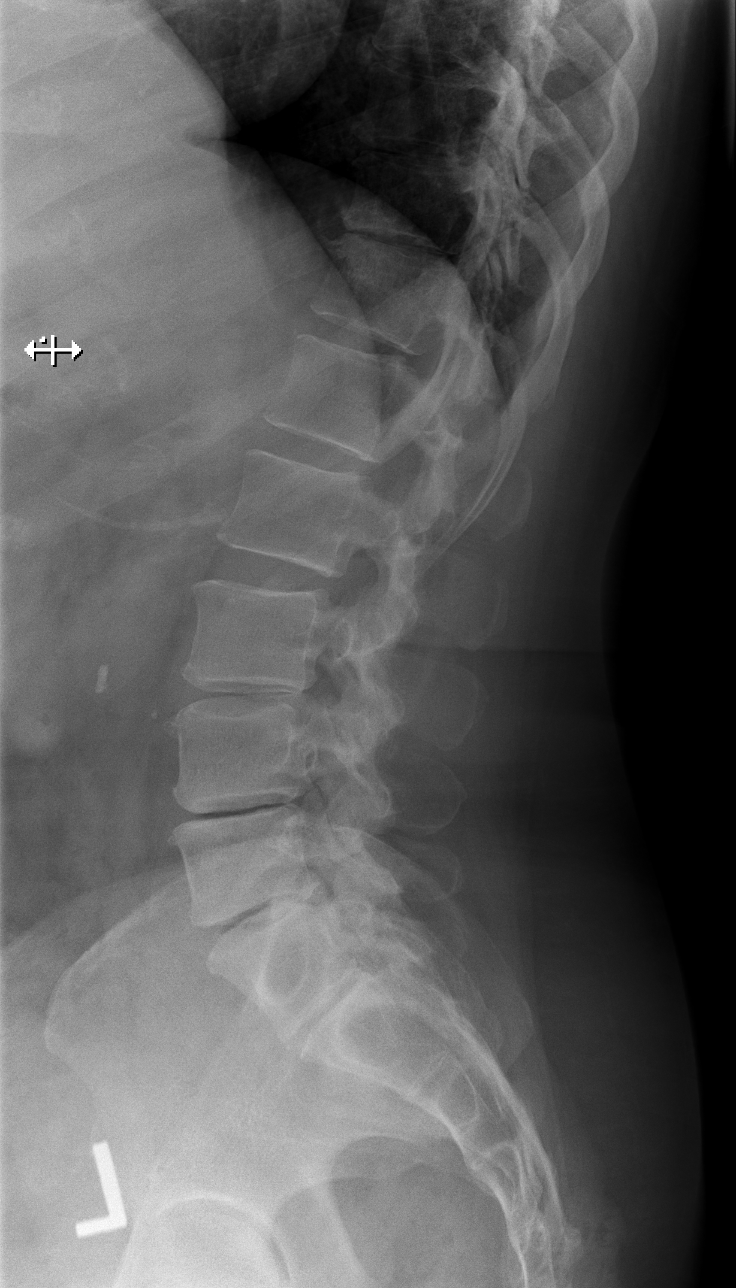

[t lumbar l-5 s-1 spot]
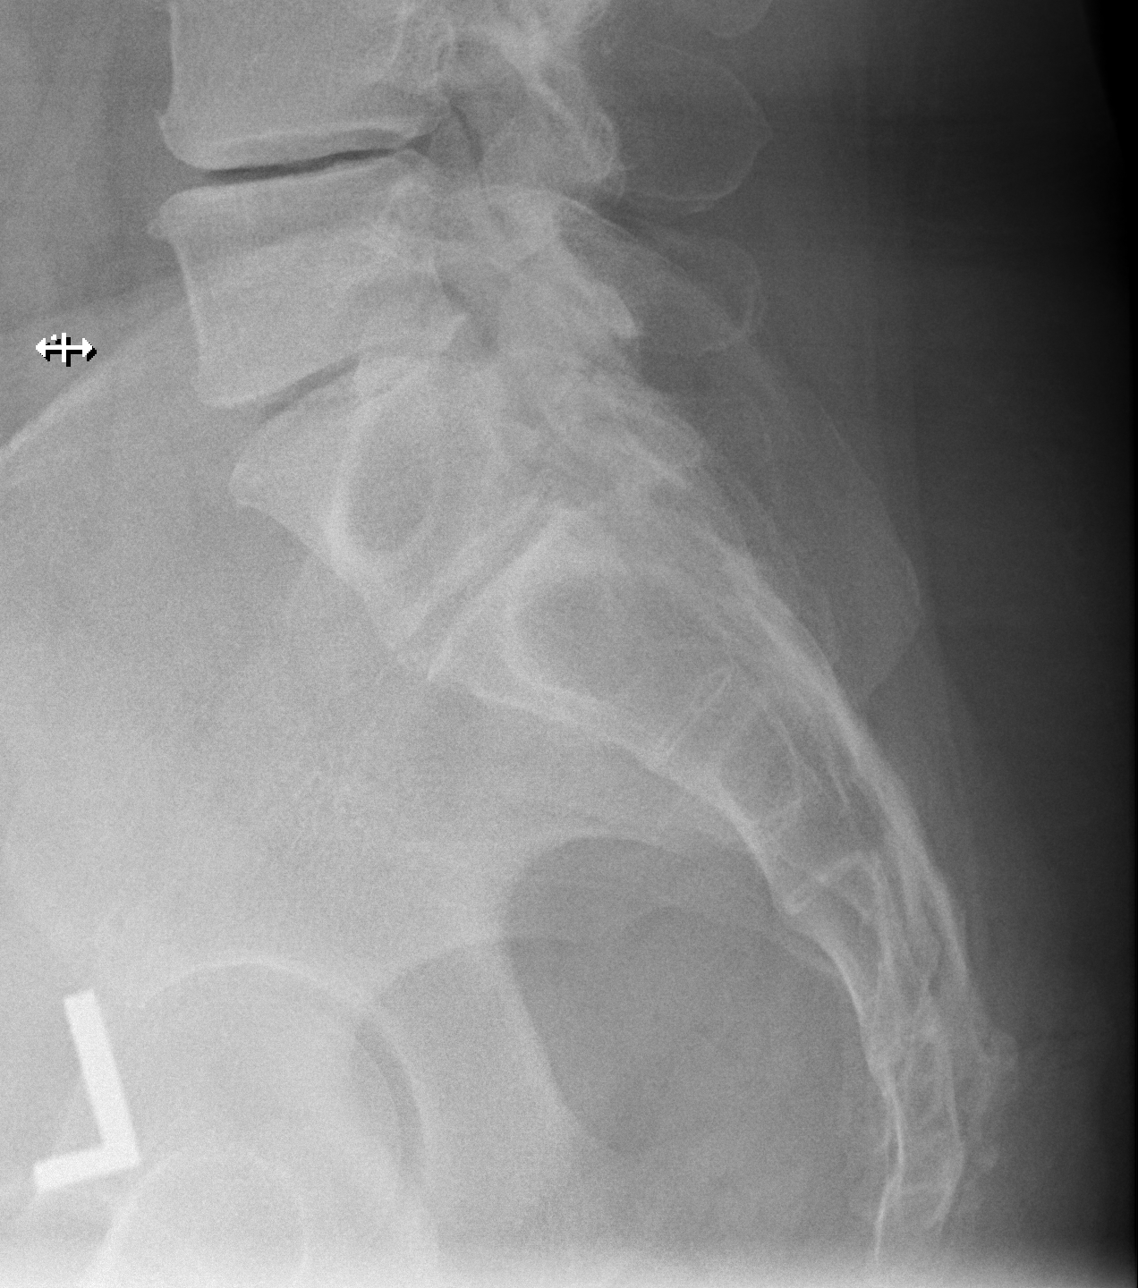

[5 of 5 positions shown; findings below may reference images not displayed]

FINDINGS: There are 5 non rib bearing segments of the lumbar spine. Normal
lumbar lordosis. No fracture or listhesis of the lumbar spine.
Vertebral body height has been preserved. There is intervertebral
disc space narrowing and endplate remodeling at L3-4, L4-5, and
L5-S1 in keeping with moderate degenerative disc disease. Vacuum
disc phenomena is noted at L4-5 and L5-S1. Rudimentary disc noted at
S1-2. Oblique views demonstrate no evidence of pars defect. The
paraspinal soft tissues are unremarkable.
IMPRESSION: Degenerative disc disease of the lower lumbar spine. No fracture or
listhesis.

## 2022-08-28 ENCOUNTER — Emergency Department (HOSPITAL_BASED_OUTPATIENT_CLINIC_OR_DEPARTMENT_OTHER): Payer: Medicaid Other | Admitting: Radiology

## 2022-08-28 ENCOUNTER — Other Ambulatory Visit: Payer: Self-pay

## 2022-08-28 ENCOUNTER — Ambulatory Visit (HOSPITAL_BASED_OUTPATIENT_CLINIC_OR_DEPARTMENT_OTHER)
Admission: EM | Admit: 2022-08-28 | Discharge: 2022-08-29 | Disposition: A | Discharge status: Home / Self Care | Payer: Medicaid Other | Attending: Emergency Medicine | Admitting: Emergency Medicine

## 2022-08-28 DIAGNOSIS — Z79890 Hormone replacement therapy (postmenopausal): Secondary | ICD-10-CM | POA: Diagnosis not present

## 2022-08-28 DIAGNOSIS — Z79899 Other long term (current) drug therapy: Secondary | ICD-10-CM | POA: Diagnosis not present

## 2022-08-28 DIAGNOSIS — K200 Eosinophilic esophagitis: Secondary | ICD-10-CM | POA: Diagnosis not present

## 2022-08-28 DIAGNOSIS — K317 Polyp of stomach and duodenum: Secondary | ICD-10-CM | POA: Insufficient documentation

## 2022-08-28 DIAGNOSIS — T18128A Food in esophagus causing other injury, initial encounter: Secondary | ICD-10-CM | POA: Diagnosis not present

## 2022-08-28 DIAGNOSIS — W44F3XA Food entering into or through a natural orifice, initial encounter: Secondary | ICD-10-CM | POA: Insufficient documentation

## 2022-08-28 DIAGNOSIS — F17200 Nicotine dependence, unspecified, uncomplicated: Secondary | ICD-10-CM | POA: Insufficient documentation

## 2022-08-28 DIAGNOSIS — E039 Hypothyroidism, unspecified: Secondary | ICD-10-CM | POA: Insufficient documentation

## 2022-08-28 DIAGNOSIS — K449 Diaphragmatic hernia without obstruction or gangrene: Secondary | ICD-10-CM | POA: Diagnosis not present

## 2022-08-28 DIAGNOSIS — K219 Gastro-esophageal reflux disease without esophagitis: Secondary | ICD-10-CM | POA: Diagnosis present

## 2022-08-28 MED ORDER — GLUCAGON HCL RDNA (DIAGNOSTIC) 1 MG IJ SOLR
1.0000 mg | Freq: Once | INTRAMUSCULAR | Status: AC
  Administered 2022-08-28: 1 mg via INTRAVENOUS
  Filled 2022-08-28: qty 1

## 2022-08-28 MED ORDER — LACTATED RINGERS IV BOLUS
1000.0000 mL | Freq: Once | INTRAVENOUS | Status: AC
  Administered 2022-08-28: 1000 mL via INTRAVENOUS

## 2022-08-28 MED ORDER — ONDANSETRON HCL 4 MG/2ML IJ SOLN
4.0000 mg | Freq: Once | INTRAMUSCULAR | Status: AC
  Administered 2022-08-28: 4 mg via INTRAVENOUS
  Filled 2022-08-28: qty 2

## 2022-08-28 NOTE — ED Provider Notes (Signed)
[facility] Provider Note   CSN: [id number] Arrival date & time: 08/28/22  1836     History {Add pertinent medical, surgical, social history, OB history to HPI:1} Chief Complaint  Patient presents with   Gastroesophageal Reflux   Nausea    Linda Price is a 57 y.o. female.  HPI     Home Medications Prior to Admission medications   Medication Sig Start Date End Date Taking? Authorizing Provider  buPROPion (WELLBUTRIN XL) 150 MG 24 hr tablet Take 150 mg by mouth daily. Patient not taking: Reported on 11/28/2019    [provider]  clobetasol ointment (TEMOVATE) [id number] % Apply 1 application topically 2 (two) times daily. Apply to affected area for 2 weeks Patient not taking: Reported on 06/10/2019 05/08/19   Woodroe Mode, MD  gabapentin (NEURONTIN) 100 MG capsule Take 100 mg by mouth 3 (three) times daily.    [provider]  ibuprofen (ADVIL) 800 MG tablet Take 800 mg by mouth every 8 (eight) hours as needed.    [provider]  levothyroxine (SYNTHROID) 125 MCG tablet Take 125 mcg by mouth daily before breakfast.    [provider]      Allergies    Banana, Avocado, Pineapple, and Shrimp [shellfish allergy]    Review of Systems   Review of Systems  Physical Exam Updated Vital Signs BP 129/74   Pulse 80   Temp 98 F (36.7 C) (Temporal)   Resp 16   Ht 5\' 2"  (1.575 m)   Wt 72.6 kg   SpO2 96%   BMI 29.26 kg/m  Physical Exam  ED Results / Procedures / Treatments   Labs (all labs ordered are listed, but only abnormal results are displayed) Labs Reviewed - No data to display  EKG EKG Interpretation  Date/Time:  Sunday August 28 2022 19:57:46 EDT Ventricular Rate:  87 PR Interval:  146 QRS Duration: 89 QT Interval:  366 QTC Calculation: 441 R Axis:   48 Text Interpretation: Sinus rhythm No previous ECGs available Confirmed by Gareth Morgan [phone]) on 08/28/2022 8:49:52  PM  Radiology DG Neck Soft Tissue  Result Date: 08/28/2022 CLINICAL DATA:  Throat pain and discomfort, difficulty swallowing, felt like she had a piece of food stuck in her throat yesterday now gone, some reflux, can't keep anything down now EXAM: NECK SOFT TISSUES - 1+ VIEW COMPARISON:  None Available. FINDINGS: Airway patent. Prevertebral soft tissues normal thickness. Epiglottis and aryepiglottic folds normal appearance. Scattered cartilaginous calcifications. Disc space narrowing at C5-C6 and C6-C7. No radiopaque foreign bodies. IMPRESSION: No acute abnormalities. Electronically Signed   By: Lavonia Dana M.D.   On: 08/28/2022 20:22   DG Chest 2 View  Result Date: 08/28/2022 CLINICAL DATA:  Throat pain and discomfort, difficulty swallowing, felt like she had a piece of food stuck in her throat yesterday now gone, some reflux, can't keep anything down now EXAM: CHEST - 2 VIEW COMPARISON:  None Available. FINDINGS: Normal heart size, mediastinal contours, and pulmonary vascularity. Lungs clear. No pleural effusion or pneumothorax. Bones unremarkable. IMPRESSION: Normal exam. Electronically Signed   By: Lavonia Dana M.D.   On: 08/28/2022 20:21    Procedures Procedures  {Document cardiac monitor, telemetry assessment procedure when appropriate:1}  Medications Ordered in ED Medications  glucagon (human recombinant) (GLUCAGEN) injection 1 mg (1 mg Intravenous Given 08/28/22 2140)  ondansetron (ZOFRAN) injection 4 mg (4 mg Intravenous Given 08/28/22 2139)  lactated ringers bolus 1,000 mL (1,000 mLs  Intravenous New Bag/Given 08/28/22 2152)    ED Course/ Medical Decision Making/ A&P   {   Click here for ABCD2, HEART and other calculatorsREFRESH Note before signing :1}                          Medical Decision Making Amount and/or Complexity of Data Reviewed Radiology: ordered.  Risk Prescription drug management.   ***  {Document critical care time when appropriate:1} {Document review of  labs and clinical decision tools ie heart score, Chads2Vasc2 etc:1}  {Document your independent review of radiology images, and any outside records:1} {Document your discussion with family members, caretakers, and with consultants:1} {Document social determinants of health affecting pt's care:1} {Document your decision making why or why not admission, treatments were needed:1} Final Clinical Impression(s) / ED Diagnoses Final diagnoses:  None    Rx / DC Orders ED Discharge Orders     None

## 2022-08-28 NOTE — ED Notes (Signed)
Report called to Friona at [facility]. Patient awaiting ride.

## 2022-08-28 NOTE — ED Triage Notes (Signed)
Patient arrives ambulatory to triage with complaints of throat discomfort/trouble swallowing. Patient states that she felt like she had a piece of food stuck in her throat yesterday (cleared up overnight). Now, she is feeling like she can't keep anything in her throat, having some reflux.   Rates throat discomfort a 5/10.

## 2022-08-28 NOTE — ED Provider Notes (Signed)
[facility] DEPT Provider Note: Georgena Spurling, MD, FACEP  CSN: [id number] MRN: [id number] ARRIVAL: 08/28/22 at [place]: [id number]   CHIEF COMPLAINT  Gastroesophageal Reflux and Nausea   HISTORY OF PRESENT ILLNESS  08/28/22 11:35 PM Linda Price is a 57 y.o. female who was sent from [facility] for an esophageal food impaction for about 24 hours.  She has a history of the same in the past.  She is still having throat discomfort which she rates as a 5 out of 10.  She is still not able to swallow.  He was given IV fluids, glucagon and Zofran prior to transfer without relief of her obstruction.  Patient is affiliated with [facility] gastroenterology and was accepted for transport here by Dr. Randel Pigg.  No past medical history on file.  Past Surgical History:  Procedure Laterality Date   CESAREAN SECTION      No family history on file.  Social History   Tobacco Use   Smoking status: Some Days   Smokeless tobacco: Never   Tobacco comments:    on Wellbutrin    Prior to Admission medications   Medication Sig Start Date End Date Taking? Authorizing Provider  buPROPion (WELLBUTRIN XL) 150 MG 24 hr tablet Take 150 mg by mouth daily. Patient not taking: Reported on 11/28/2019    [provider]  clobetasol ointment (TEMOVATE) [id number] % Apply 1 application topically 2 (two) times daily. Apply to affected area for 2 weeks Patient not taking: Reported on 06/10/2019 05/08/19   Woodroe Mode, MD  gabapentin (NEURONTIN) 100 MG capsule Take 100 mg by mouth 3 (three) times daily.    [provider]  ibuprofen (ADVIL) 800 MG tablet Take 800 mg by mouth every 8 (eight) hours as needed.    [provider]  levothyroxine (SYNTHROID) 125 MCG tablet Take 125 mcg by mouth daily before breakfast.    [provider]    Allergies Banana, Avocado, Pineapple, and Shrimp [shellfish allergy]   REVIEW OF SYSTEMS  Negative except as noted here or in the  History of Present Illness.   PHYSICAL EXAMINATION  Initial Vital Signs Blood pressure 111/79, pulse 94, temperature 98 F (36.7 C), temperature source Oral, resp. rate 19, height 5\' 2"  (1.575 m), weight 72.6 kg, SpO2 93 %.  Examination General: Well-developed, well-nourished female in no acute distress; appearance consistent with age of record HENT: normocephalic; atraumatic Eyes: Normal appearance Neck: supple Heart: regular rate and rhythm Lungs: clear to auscultation bilaterally Abdomen: soft; nondistended; nontender; bowel sounds present Extremities: No deformity; full range of motion; pulses normal Neurologic: Awake, alert and oriented; motor function intact in all extremities and symmetric; no facial droop Skin: Warm and dry Psychiatric: Normal mood and affect   RESULTS  Summary of this visit's results, reviewed and interpreted by myself:   EKG Interpretation  Date/Time:  Sunday August 28 2022 19:57:46 EDT Ventricular Rate:  87 PR Interval:  146 QRS Duration: 89 QT Interval:  366 QTC Calculation: 441 R Axis:   48 Text Interpretation: Sinus rhythm No previous ECGs available Confirmed by Gareth Morgan [phone]) on 08/28/2022 8:49:52 PM       Laboratory Studies: No results found for this or any previous visit (from the past 24 hour(s)). Imaging Studies: DG Neck Soft Tissue  Result Date: 08/28/2022 CLINICAL DATA:  Throat pain and discomfort, difficulty swallowing, felt like she had a piece of food stuck in her throat yesterday now gone, some reflux, can't keep anything down now  EXAM: NECK SOFT TISSUES - 1+ VIEW COMPARISON:  None Available. FINDINGS: Airway patent. Prevertebral soft tissues normal thickness. Epiglottis and aryepiglottic folds normal appearance. Scattered cartilaginous calcifications. Disc space narrowing at C5-C6 and C6-C7. No radiopaque foreign bodies. IMPRESSION: No acute abnormalities. Electronically Signed   By: Lavonia Dana M.D.   On: 08/28/2022 20:22    DG Chest 2 View  Result Date: 08/28/2022 CLINICAL DATA:  Throat pain and discomfort, difficulty swallowing, felt like she had a piece of food stuck in her throat yesterday now gone, some reflux, can't keep anything down now EXAM: CHEST - 2 VIEW COMPARISON:  None Available. FINDINGS: Normal heart size, mediastinal contours, and pulmonary vascularity. Lungs clear. No pleural effusion or pneumothorax. Bones unremarkable. IMPRESSION: Normal exam. Electronically Signed   By: Lavonia Dana M.D.   On: 08/28/2022 20:21    ED COURSE and MDM  Nursing notes, initial and subsequent vitals signs, including pulse oximetry, reviewed and interpreted by myself.  Vitals:   08/28/22 2130 08/28/22 2200 08/28/22 2230 08/28/22 2238  BP: (!) 133/93 119/66 111/79   Pulse: (!) 102 94    Resp: (!) 24 12 19    Temp:    98 F (36.7 C)  TempSrc:    Oral  SpO2: 98% 93%    Weight:      Height:       Medications  glucagon (human recombinant) (GLUCAGEN) injection 1 mg (has no administration in time range)  glucagon (human recombinant) (GLUCAGEN) injection 1 mg (1 mg Intravenous Given 08/28/22 2140)  ondansetron (ZOFRAN) injection 4 mg (4 mg Intravenous Given 08/28/22 2139)  lactated ringers bolus 1,000 mL (0 mLs Intravenous Stopped 08/28/22 2259)   11:41 PM Discussed with Dr. Randel Pigg, who plans to take the patient to the endoscopy suite at 6:30 AM unless the food impaction is relieved in the meantime.  6:38 AM Patient taken to endoscopy suite.  PROCEDURES  Procedures   ED DIAGNOSES     ICD-10-CM   1. Food impaction of esophagus, initial encounter  [id number]    [id number]          Haile Toppins, Jenny Reichmann, MD 08/29/22 [phone]

## 2022-08-28 NOTE — ED Notes (Signed)
Pt IV secured with rolled gauze and tape, patient to be transported to [facility] ED by POV. Reviewed AVS with patient, patient expressed understanding of directions, denies further questions at this time.

## 2022-08-29 ENCOUNTER — Emergency Department (EMERGENCY_DEPARTMENT_HOSPITAL): Payer: Medicaid Other | Admitting: Certified Registered Nurse Anesthetist

## 2022-08-29 ENCOUNTER — Encounter (HOSPITAL_COMMUNITY)
Admission: EM | Disposition: A | Discharge status: Home / Self Care | Payer: Self-pay | Source: Home / Self Care | Attending: Emergency Medicine

## 2022-08-29 ENCOUNTER — Emergency Department (HOSPITAL_COMMUNITY): Payer: Medicaid Other | Admitting: Certified Registered Nurse Anesthetist

## 2022-08-29 ENCOUNTER — Encounter (HOSPITAL_COMMUNITY): Payer: Self-pay

## 2022-08-29 DIAGNOSIS — K317 Polyp of stomach and duodenum: Secondary | ICD-10-CM

## 2022-08-29 DIAGNOSIS — K449 Diaphragmatic hernia without obstruction or gangrene: Secondary | ICD-10-CM

## 2022-08-29 DIAGNOSIS — T18128A Food in esophagus causing other injury, initial encounter: Secondary | ICD-10-CM

## 2022-08-29 HISTORY — PX: BIOPSY: SHX5522

## 2022-08-29 HISTORY — PX: FOREIGN BODY REMOVAL: SHX962

## 2022-08-29 HISTORY — PX: ESOPHAGOGASTRODUODENOSCOPY: SHX5428

## 2022-08-29 SURGERY — EGD (ESOPHAGOGASTRODUODENOSCOPY)
Anesthesia: General

## 2022-08-29 MED ORDER — PROPOFOL 1000 MG/100ML IV EMUL
INTRAVENOUS | Status: AC
  Filled 2022-08-29: qty 100

## 2022-08-29 MED ORDER — FENTANYL CITRATE (PF) 100 MCG/2ML IJ SOLN
INTRAMUSCULAR | Status: DC | PRN
  Administered 2022-08-29: 100 ug via INTRAVENOUS

## 2022-08-29 MED ORDER — LACTATED RINGERS IV SOLN
INTRAVENOUS | Status: AC | PRN
  Administered 2022-08-29: 1000 mL via INTRAVENOUS

## 2022-08-29 MED ORDER — FENTANYL CITRATE (PF) 100 MCG/2ML IJ SOLN
INTRAMUSCULAR | Status: AC
  Filled 2022-08-29: qty 2

## 2022-08-29 MED ORDER — SUCCINYLCHOLINE CHLORIDE 200 MG/10ML IV SOSY
PREFILLED_SYRINGE | INTRAVENOUS | Status: DC | PRN
  Administered 2022-08-29: 120 mg via INTRAVENOUS

## 2022-08-29 MED ORDER — PROPOFOL 10 MG/ML IV BOLUS
INTRAVENOUS | Status: AC
  Filled 2022-08-29: qty 20

## 2022-08-29 MED ORDER — PROPOFOL 10 MG/ML IV BOLUS
INTRAVENOUS | Status: DC | PRN
  Administered 2022-08-29: 150 mg via INTRAVENOUS

## 2022-08-29 MED ORDER — MIDAZOLAM HCL 2 MG/2ML IJ SOLN
INTRAMUSCULAR | Status: AC
  Filled 2022-08-29: qty 2

## 2022-08-29 MED ORDER — LIDOCAINE 2% (20 MG/ML) 5 ML SYRINGE
INTRAMUSCULAR | Status: DC | PRN
  Administered 2022-08-29: 60 mg via INTRAVENOUS

## 2022-08-29 NOTE — Op Note (Signed)
[facility] Patient Name: Linda Price Procedure Date: 08/29/2022 MRN: [id number] Attending MD: Danton Clap DO, DO, [id number] Date of Birth: [birth year] CSN: [id number] Age: 57 Admit Type: Outpatient Procedure:                Upper GI endoscopy Indications:              Foreign body in the esophagus Providers:                Danton Clap DO, DO, Doristine Johns, RN,                            Frazier Richards, Technician Referring MD:              Medicines:                See the Anesthesia note for documentation of the                            administered medications Complications:            No immediate complications. Estimated Blood Loss:     Estimated blood loss was minimal. Procedure:                Pre-Anesthesia Assessment:                           - ASA Grade Assessment: II - A patient with mild                            systemic disease.                           - The risks and benefits of the procedure and the                            sedation options and risks were discussed with the                            patient. All questions were answered and informed                            consent was obtained.                           After obtaining informed consent, the endoscope was                            passed under direct vision. Throughout the                            procedure, the patient's blood pressure, pulse, and                            oxygen saturations were monitored continuously. The                            [id number] Olympus endoscope was  introduced                            through the mouth, and advanced to the second part                            of duodenum. The upper GI endoscopy was                            accomplished without difficulty. The patient                            tolerated the procedure well. Scope In: Scope Out: Findings:      Food was found in the middle third of the  esophagus. Removal was       accomplished with a Roth net. The foreign body removal site was examined       following endoscope reinsertion and showed healthy appearing mucosa.       Biopsies were obtained from the proximal and distal esophagus with cold       forceps for histology of suspected eosinophilic esophagitis.      A 4 cm hiatal hernia was present.      A few sessile polyps with no stigmata of recent bleeding were found in       the gastric body.      The examined duodenum was normal. Impression:               - Food was found in the esophagus. Removal was                            successful.                           - 4 cm hiatal hernia.                           - A few gastric polyps.                           - Normal examined duodenum.                           - Biopsies were taken with a cold forceps for                            evaluation of eosinophilic esophagitis. Moderate Sedation:      See the other procedure note for documentation of moderate sedation with       intraservice time. Recommendation:           - Discharge patient to home.                           - Clear liquid diet today, advance to solid foods                            as tolerated, chew food thoroughly.                           -  Continue present medications.                           - Await pathology results.                           - Follow up with me in office within the next 1                            month. Procedure Code(s):        --- Professional ---                           [phone], Esophagogastroduodenoscopy, flexible,                            transoral; with removal of foreign body(s)                           43239, Esophagogastroduodenoscopy, flexible,                            transoral; with biopsy, single or multiple Diagnosis Code(s):        --- Professional ---                           [id number], Food in esophagus causing other injury,                             initial encounter                           K44.9, Diaphragmatic hernia without obstruction or                            gangrene                           T18.108A, Unspecified foreign body in esophagus                            causing other injury, initial encounter                           K31.7, Polyp of stomach and duodenum CPT copyright 2022 [employer]. All rights reserved. The codes documented in this report are preliminary and upon coder review may  be revised to meet current compliance requirements. Dr Danton Clap, DO Danton Clap DO, DO 08/29/2022 7:20:09 AM Number of Addenda: 0

## 2022-08-29 NOTE — ED Notes (Signed)
Patient being taken to endoscopy at this time.  

## 2022-08-29 NOTE — Interval H&P Note (Signed)
History and Physical Interval Note:  08/29/2022 6:39 AM  Linda Price  has presented today for surgery, with the diagnosis of food bolus.  The various methods of treatment have been discussed with the patient and family. After consideration of risks, benefits and other options for treatment, the patient has consented to  Procedure(s): ESOPHAGOGASTRODUODENOSCOPY (EGD) (N/A) as a surgical intervention.  The patient's history has been reviewed, patient examined, no change in status, stable for surgery.  I have reviewed the patient's chart and labs.  Questions were answered to the patient's satisfaction.     Loney Laurence

## 2022-08-29 NOTE — Anesthesia Procedure Notes (Signed)
Procedure Name: Intubation Date/Time: 08/29/2022 6:50 AM  Performed by: Claudia Desanctis, CRNAPre-anesthesia Checklist: Patient identified, Emergency Drugs available, Suction available and Patient being monitored Patient Re-evaluated:Patient Re-evaluated prior to induction Oxygen Delivery Method: Circle system utilized Preoxygenation: Pre-oxygenation with 100% oxygen Induction Type: IV induction, Rapid sequence and Cricoid Pressure applied Laryngoscope Size: 2 and Miller Grade View: Grade I Tube type: Oral Tube size: 7.0 mm Number of attempts: 1 Airway Equipment and Method: Stylet Placement Confirmation: ETT inserted through vocal cords under direct vision, positive ETCO2 and breath sounds checked- equal and bilateral Secured at: 21 cm Tube secured with: Tape Dental Injury: Teeth and Oropharynx as per pre-operative assessment

## 2022-08-29 NOTE — Anesthesia Preprocedure Evaluation (Signed)
Anesthesia Evaluation  Patient identified by MRN, date of birth, ID band Patient awake    Reviewed: Allergy & Precautions, NPO status , Patient's Chart, lab work & pertinent test results  Airway Mallampati: II  TM Distance: >3 FB Neck ROM: Full    Dental no notable dental hx.    Pulmonary Current Smoker and Patient abstained from smoking.   Pulmonary exam normal breath sounds clear to auscultation       Cardiovascular negative cardio ROS Normal cardiovascular exam Rhythm:Regular Rate:Normal     Neuro/Psych negative neurological ROS  negative psych ROS   GI/Hepatic Neg liver ROS,,,Food impaction Dysphagia   Endo/Other  Hypothyroidism    Renal/GU negative Renal ROS  negative genitourinary   Musculoskeletal negative musculoskeletal ROS (+)    Abdominal   Peds  Hematology negative hematology ROS (+)   Anesthesia Other Findings   Reproductive/Obstetrics                             Anesthesia Physical Anesthesia Plan  ASA: 2  Anesthesia Plan: General   Post-op Pain Management: Minimal or no pain anticipated   Induction: Intravenous, Rapid sequence and Cricoid pressure planned  PONV Risk Score and Plan: 2 and Treatment may vary due to age or medical condition and Ondansetron  Airway Management Planned: Oral ETT  Additional Equipment: None  Intra-op Plan:   Post-operative Plan: Extubation in OR  Informed Consent: I have reviewed the patients History and Physical, chart, labs and discussed the procedure including the risks, benefits and alternatives for the proposed anesthesia with the patient or authorized representative who has indicated his/her understanding and acceptance.     Dental advisory given  Plan Discussed with: CRNA and Anesthesiologist  Anesthesia Plan Comments:        Anesthesia Quick Evaluation

## 2022-08-29 NOTE — Transfer of Care (Signed)
Immediate Anesthesia Transfer of Care Note  Patient: Linda Price  Procedure(s) Performed: ESOPHAGOGASTRODUODENOSCOPY (EGD) FOREIGN BODY REMOVAL BIOPSY  Patient Location: PACU and Endoscopy Unit  Anesthesia Type:General  Level of Consciousness: drowsy and patient cooperative  Airway & Oxygen Therapy: Patient Spontanous Breathing  Post-op Assessment: Report given to RN and Post -op Vital signs reviewed and stable  Post vital signs: Reviewed and stable  Last Vitals:  Vitals Value Taken Time  BP    Temp    Pulse    Resp    SpO2      Last Pain:  Vitals:   08/29/22 0600  TempSrc: Temporal  PainSc: 0-No pain         Complications: No notable events documented.

## 2022-08-29 NOTE — Consult Note (Signed)
[facility] ([facility]) Gastroenterology Consult  Referring Provider: No ref. provider found Primary Care Physician:  Jordan Hawks, [place] Primary Gastroenterologist: None  Reason for Consultation: Possible food impaction, dysphagia  SUBJECTIVE:   HPI: Linda Price is a 57 y.o. female with past medical history significant for tobacco use.  Presented to outside emergency department on 08/28/2022 with a sensation of food bolus, steak that she had consumed on 08/27/2022.  She feels that the steak has moved, though she is unable to tolerate liquids without emesis.  She is tolerating her secretions.  She noted having similar situations for many years though has never had persistent symptoms such as this.  She denied any chest pain or shortness of breath.  No abdominal pain.  No changes in her bowel habits.  No prior EGD. Had colonoscopy roughly 3 years prior at [facility] with findings of benign polyps, recall 10 years. Surgical history significant for cesarean section, metal in left ankle (pins).  History reviewed. No pertinent past medical history. Past Surgical History:  Procedure Laterality Date   CESAREAN SECTION     Prior to Admission medications   Medication Sig Start Date End Date Taking? Authorizing Provider  ascorbic acid (VITAMIN C) 500 MG tablet Take 500 mg by mouth daily as needed (cold symptoms).   Yes [provider]  levocetirizine (XYZAL) 5 MG tablet Take 5 mg by mouth daily as needed for allergies.   Yes [provider]  levothyroxine (SYNTHROID) 125 MCG tablet Take 125 mcg by mouth daily before breakfast.   Yes [provider]  clobetasol ointment (TEMOVATE) [id number] % Apply 1 application topically 2 (two) times daily. Apply to affected area for 2 weeks Patient not taking: Reported on 06/10/2019 05/08/19   Woodroe Mode, MD   Current Facility-Administered Medications  Medication Dose Route Frequency Provider Last Rate Last Admin   lactated ringers infusion     Continuous PRN Loney Laurence, DO 10 mL/hr at 08/29/22 0604 Restarted at 08/29/22 [id number]   Allergies as of 08/28/2022 - Review Complete 08/28/2022  Allergen Reaction Noted   Banana  04/05/2019   Avocado Itching 06/10/2019   Pineapple Itching 06/10/2019   Shrimp [shellfish allergy] Itching 06/10/2019   History reviewed. No pertinent family history. Social History   Socioeconomic History   Marital status: Unknown    Spouse name: Not on file   Number of children: Not on file   Years of education: Not on file   Highest education level: Not on file  Occupational History   Not on file  Tobacco Use   Smoking status: Some Days   Smokeless tobacco: Never   Tobacco comments:    on Wellbutrin  Substance and Sexual Activity   Alcohol use: Not on file   Drug use: Not on file   Sexual activity: Not on file  Other Topics Concern   Not on file  Social History Narrative   Not on file   Social Determinants of Health   Financial Resource Strain: Not on file  Food Insecurity: Not on file  Transportation Needs: Not on file  Physical Activity: Not on file  Stress: Not on file  Social Connections: Not on file  Intimate Partner Violence: Not on file   Review of Systems:  Review of Systems  Respiratory:  Negative for shortness of breath.   Cardiovascular:  Negative for chest pain.  Gastrointestinal:  Negative for abdominal pain, blood in stool, constipation, diarrhea and melena.       Dysphagia.  OBJECTIVE:   Temp:  [97.9 F (36.6 C)-98.5 F (36.9 C)] 98.5 F (36.9 C) (03/25 0600) Pulse Rate:  [79-102] 79 (03/25 0600) Resp:  [12-24] 12 (03/25 0600) BP: (104-148)/(52-93) 135/52 (03/25 0600) SpO2:  [92 %-98 %] 96 % (03/25 0600) Weight:  [72.6 kg] 72.6 kg (03/24 1856)   Physical Exam Constitutional:      General: She is not in acute distress.    Appearance: She is not ill-appearing, toxic-appearing or diaphoretic.  Cardiovascular:     Rate and Rhythm: Normal rate and  regular rhythm.  Pulmonary:     Effort: No respiratory distress.     Breath sounds: Normal breath sounds.     Comments: No supplemental oxygen in place at time of exam. Abdominal:     General: Bowel sounds are normal. There is no distension.     Palpations: Abdomen is soft.     Tenderness: There is no abdominal tenderness. There is no guarding.  Musculoskeletal:     Right lower leg: No edema.     Left lower leg: No edema.  Skin:    General: Skin is warm and dry.  Neurological:     Mental Status: She is alert.     Labs: No results for input(s): "WBC", "HGB", "HCT", "PLT" in the last 72 hours. BMET No results for input(s): "NA", "K", "CL", "CO2", "GLUCOSE", "BUN", "CREATININE", "CALCIUM" in the last 72 hours. LFT No results for input(s): "PROT", "ALBUMIN", "AST", "ALT", "ALKPHOS", "BILITOT", "BILIDIR", "IBILI" in the last 72 hours. PT/INR No results for input(s): "LABPROT", "INR" in the last 72 hours.  Diagnostic imaging: DG Neck Soft Tissue  Result Date: 08/28/2022 CLINICAL DATA:  Throat pain and discomfort, difficulty swallowing, felt like she had a piece of food stuck in her throat yesterday now gone, some reflux, can't keep anything down now EXAM: NECK SOFT TISSUES - 1+ VIEW COMPARISON:  None Available. FINDINGS: Airway patent. Prevertebral soft tissues normal thickness. Epiglottis and aryepiglottic folds normal appearance. Scattered cartilaginous calcifications. Disc space narrowing at C5-C6 and C6-C7. No radiopaque foreign bodies. IMPRESSION: No acute abnormalities. Electronically Signed   By: Lavonia Dana M.D.   On: 08/28/2022 20:22   DG Chest 2 View  Result Date: 08/28/2022 CLINICAL DATA:  Throat pain and discomfort, difficulty swallowing, felt like she had a piece of food stuck in her throat yesterday now gone, some reflux, can't keep anything down now EXAM: CHEST - 2 VIEW COMPARISON:  None Available. FINDINGS: Normal heart size, mediastinal contours, and pulmonary  vascularity. Lungs clear. No pleural effusion or pneumothorax. Bones unremarkable. IMPRESSION: Normal exam. Electronically Signed   By: Lavonia Dana M.D.   On: 08/28/2022 20:21    IMPRESSION: Possible food impaction Dysphagia  PLAN: -I personally discussed the EGD procedure in detail, discussed benefits, alternatives and risks including bleeding/infection/perforation/risk with anesthesia, she verbalized understanding and elected to proceed -Proceed to EGD this morning -Further recommendations to follow pending endoscopy   LOS: 0 days   Danton Clap, [facility] Gastroenterology

## 2022-08-29 NOTE — Brief Op Note (Signed)
08/29/2022  7:06 AM  PATIENT:  Linda Price  57 y.o. 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

## 2022-08-29 NOTE — Anesthesia Postprocedure Evaluation (Signed)
Anesthesia Post Note  Patient: Linda Price  Procedure(s) Performed: ESOPHAGOGASTRODUODENOSCOPY (EGD) FOREIGN BODY REMOVAL BIOPSY     Patient location during evaluation: PACU Anesthesia Type: General Level of consciousness: awake and alert and oriented Pain management: pain level controlled Vital Signs Assessment: post-procedure vital signs reviewed and stable Respiratory status: spontaneous breathing, nonlabored ventilation and respiratory function stable Cardiovascular status: blood pressure returned to baseline and stable Postop Assessment: no apparent nausea or vomiting Anesthetic complications: no   No notable events documented.  Last Vitals:  Vitals:   08/29/22 0722 08/29/22 0731  BP: (!) 127/53 (!) 144/61  Pulse: 92 93  Resp: 11 14  Temp:    SpO2: 92% 93%    Last Pain:  Vitals:   08/29/22 0731  TempSrc:   PainSc: 0-No pain                 [name]

## 2022-08-29 NOTE — H&P (View-Only) (Signed)
[facility] Gastroenterology Consult  Referring Provider: No ref. provider found Primary Care Physician:  Jordan Hawks, [place] Primary Gastroenterologist: None  Reason for Consultation: Possible food impaction, dysphagia  SUBJECTIVE:   HPI: Linda Price is a 57 y.o. female with past medical history significant for tobacco use.  Presented to outside emergency department on 08/28/2022 with a sensation of food bolus, steak that she had consumed on 08/27/2022.  She feels that the steak has moved, though she is unable to tolerate liquids without emesis.  She is tolerating her secretions.  She noted having similar situations for many years though has never had persistent symptoms such as this.  She denied any chest pain or shortness of breath.  No abdominal pain.  No changes in her bowel habits.  No prior EGD. Had colonoscopy roughly 3 years prior at [facility] with findings of benign polyps, recall 10 years. Surgical history significant for cesarean section, metal in left ankle (pins).  History reviewed. No pertinent past medical history. Past Surgical History:  Procedure Laterality Date   CESAREAN SECTION     Prior to Admission medications   Medication Sig Start Date End Date Taking? Authorizing Provider  ascorbic acid (VITAMIN C) 500 MG tablet Take 500 mg by mouth daily as needed (cold symptoms).   Yes [provider]  levocetirizine (XYZAL) 5 MG tablet Take 5 mg by mouth daily as needed for allergies.   Yes [provider]  levothyroxine (SYNTHROID) 125 MCG tablet Take 125 mcg by mouth daily before breakfast.   Yes [provider]  clobetasol ointment (TEMOVATE) [id number] % Apply 1 application topically 2 (two) times daily. Apply to affected area for 2 weeks Patient not taking: Reported on 06/10/2019 05/08/19   Woodroe Mode, MD   Current Facility-Administered Medications  Medication Dose Route Frequency Provider Last Rate Last Admin   lactated ringers infusion     Continuous PRN Loney Laurence, DO 10 mL/hr at 08/29/22 0604 Restarted at 08/29/22 [id number]   Allergies as of 08/28/2022 - Review Complete 08/28/2022  Allergen Reaction Noted   Banana  04/05/2019   Avocado Itching 06/10/2019   Pineapple Itching 06/10/2019   Shrimp [shellfish allergy] Itching 06/10/2019   History reviewed. No pertinent family history. Social History   Socioeconomic History   Marital status: Unknown    Spouse name: Not on file   Number of children: Not on file   Years of education: Not on file   Highest education level: Not on file  Occupational History   Not on file  Tobacco Use   Smoking status: Some Days   Smokeless tobacco: Never   Tobacco comments:    on Wellbutrin  Substance and Sexual Activity   Alcohol use: Not on file   Drug use: Not on file   Sexual activity: Not on file  Other Topics Concern   Not on file  Social History Narrative   Not on file   Social Determinants of Health   Financial Resource Strain: Not on file  Food Insecurity: Not on file  Transportation Needs: Not on file  Physical Activity: Not on file  Stress: Not on file  Social Connections: Not on file  Intimate Partner Violence: Not on file   Review of Systems:  Review of Systems  Respiratory:  Negative for shortness of breath.   Cardiovascular:  Negative for chest pain.  Gastrointestinal:  Negative for abdominal pain, blood in stool, constipation, diarrhea and melena.       Dysphagia.  OBJECTIVE:   Temp:  [97.9 F (36.6 C)-98.5 F (36.9 C)] 98.5 F (36.9 C) (03/25 0600) Pulse Rate:  [79-102] 79 (03/25 0600) Resp:  [12-24] 12 (03/25 0600) BP: (104-148)/(52-93) 135/52 (03/25 0600) SpO2:  [92 %-98 %] 96 % (03/25 0600) Weight:  [72.6 kg] 72.6 kg (03/24 1856)   Physical Exam Constitutional:      General: She is not in acute distress.    Appearance: She is not ill-appearing, toxic-appearing or diaphoretic.  Cardiovascular:     Rate and Rhythm: Normal rate and  regular rhythm.  Pulmonary:     Effort: No respiratory distress.     Breath sounds: Normal breath sounds.     Comments: No supplemental oxygen in place at time of exam. Abdominal:     General: Bowel sounds are normal. There is no distension.     Palpations: Abdomen is soft.     Tenderness: There is no abdominal tenderness. There is no guarding.  Musculoskeletal:     Right lower leg: No edema.     Left lower leg: No edema.  Skin:    General: Skin is warm and dry.  Neurological:     Mental Status: She is alert.     Labs: No results for input(s): "WBC", "HGB", "HCT", "PLT" in the last 72 hours. BMET No results for input(s): "NA", "K", "CL", "CO2", "GLUCOSE", "BUN", "CREATININE", "CALCIUM" in the last 72 hours. LFT No results for input(s): "PROT", "ALBUMIN", "AST", "ALT", "ALKPHOS", "BILITOT", "BILIDIR", "IBILI" in the last 72 hours. PT/INR No results for input(s): "LABPROT", "INR" in the last 72 hours.  Diagnostic imaging: DG Neck Soft Tissue  Result Date: 08/28/2022 CLINICAL DATA:  Throat pain and discomfort, difficulty swallowing, felt like she had a piece of food stuck in her throat yesterday now gone, some reflux, can't keep anything down now EXAM: NECK SOFT TISSUES - 1+ VIEW COMPARISON:  None Available. FINDINGS: Airway patent. Prevertebral soft tissues normal thickness. Epiglottis and aryepiglottic folds normal appearance. Scattered cartilaginous calcifications. Disc space narrowing at C5-C6 and C6-C7. No radiopaque foreign bodies. IMPRESSION: No acute abnormalities. Electronically Signed   By: Lavonia Dana M.D.   On: 08/28/2022 20:22   DG Chest 2 View  Result Date: 08/28/2022 CLINICAL DATA:  Throat pain and discomfort, difficulty swallowing, felt like she had a piece of food stuck in her throat yesterday now gone, some reflux, can't keep anything down now EXAM: CHEST - 2 VIEW COMPARISON:  None Available. FINDINGS: Normal heart size, mediastinal contours, and pulmonary  vascularity. Lungs clear. No pleural effusion or pneumothorax. Bones unremarkable. IMPRESSION: Normal exam. Electronically Signed   By: Lavonia Dana M.D.   On: 08/28/2022 20:21    IMPRESSION: Possible food impaction Dysphagia  PLAN: -I personally discussed the EGD procedure in detail, discussed benefits, alternatives and risks including bleeding/infection/perforation/risk with anesthesia, she verbalized understanding and elected to proceed -Proceed to EGD this morning -Further recommendations to follow pending endoscopy   LOS: 0 days   Danton Clap, [facility] Gastroenterology

## 2022-08-30 LAB — SURGICAL PATHOLOGY

## 2022-08-31 ENCOUNTER — Encounter (HOSPITAL_COMMUNITY): Payer: Self-pay | Admitting: Internal Medicine

## 2023-12-15 ENCOUNTER — Other Ambulatory Visit: Payer: Self-pay | Admitting: Family Medicine

## 2023-12-15 ENCOUNTER — Other Ambulatory Visit (HOSPITAL_COMMUNITY)
Admission: RE | Admit: 2023-12-15 | Discharge: 2023-12-15 | Disposition: A | Discharge status: Home / Self Care | Source: Ambulatory Visit | Attending: Family Medicine | Admitting: Family Medicine

## 2023-12-15 DIAGNOSIS — Z124 Encounter for screening for malignant neoplasm of cervix: Secondary | ICD-10-CM | POA: Insufficient documentation

## 2023-12-15 DIAGNOSIS — Z87891 Personal history of nicotine dependence: Secondary | ICD-10-CM

## 2023-12-20 LAB — CYTOLOGY - PAP
Adequacy: ABSENT
Diagnosis: NEGATIVE

## 2023-12-22 ENCOUNTER — Ambulatory Visit
Admission: RE | Admit: 2023-12-22 | Discharge: 2023-12-22 | Disposition: A | Discharge status: Home / Self Care | Source: Ambulatory Visit | Attending: Family Medicine | Admitting: Family Medicine

## 2023-12-22 DIAGNOSIS — Z87891 Personal history of nicotine dependence: Secondary | ICD-10-CM
